# Patient Record
Sex: Male | Born: 1955 | Race: White | Hispanic: No | Marital: Single | Smoking: Former smoker
Health system: Southern US, Community
[De-identification: ages and names within clinical notes are randomized; demographics above are authoritative.]

## PROBLEM LIST (undated history)

## (undated) DIAGNOSIS — D693 Immune thrombocytopenic purpura: Secondary | ICD-10-CM

## (undated) DIAGNOSIS — I10 Essential (primary) hypertension: Secondary | ICD-10-CM

## (undated) DIAGNOSIS — K4090 Unilateral inguinal hernia, without obstruction or gangrene, not specified as recurrent: Secondary | ICD-10-CM

## (undated) DIAGNOSIS — T4145XA Adverse effect of unspecified anesthetic, initial encounter: Secondary | ICD-10-CM

## (undated) DIAGNOSIS — T8859XA Other complications of anesthesia, initial encounter: Secondary | ICD-10-CM

## (undated) DIAGNOSIS — R519 Headache, unspecified: Secondary | ICD-10-CM

## (undated) DIAGNOSIS — E785 Hyperlipidemia, unspecified: Secondary | ICD-10-CM

## (undated) DIAGNOSIS — IMO0002 Reserved for concepts with insufficient information to code with codable children: Secondary | ICD-10-CM

## (undated) DIAGNOSIS — C4491 Basal cell carcinoma of skin, unspecified: Secondary | ICD-10-CM

## (undated) DIAGNOSIS — R51 Headache: Secondary | ICD-10-CM

## (undated) HISTORY — DX: Reserved for concepts with insufficient information to code with codable children: IMO0002

## (undated) HISTORY — PX: EYE SURGERY: SHX253

## (undated) HISTORY — DX: Essential (primary) hypertension: I10

## (undated) HISTORY — DX: Immune thrombocytopenic purpura: D69.3

## (undated) HISTORY — PX: COLONOSCOPY: SHX174

## (undated) HISTORY — DX: Basal cell carcinoma of skin, unspecified: C44.91

## (undated) HISTORY — DX: Hyperlipidemia, unspecified: E78.5

## (undated) HISTORY — PX: NASAL SEPTUM SURGERY: SHX37

---

## 1988-10-22 DIAGNOSIS — C4491 Basal cell carcinoma of skin, unspecified: Secondary | ICD-10-CM

## 1988-10-22 DIAGNOSIS — D693 Immune thrombocytopenic purpura: Secondary | ICD-10-CM

## 1988-10-22 HISTORY — DX: Immune thrombocytopenic purpura: D69.3

## 1988-10-22 HISTORY — DX: Basal cell carcinoma of skin, unspecified: C44.91

## 2008-03-05 ENCOUNTER — Encounter: Admission: RE | Admit: 2008-03-05 | Discharge: 2008-03-05 | Payer: Self-pay | Admitting: Internal Medicine

## 2008-12-04 ENCOUNTER — Emergency Department (HOSPITAL_COMMUNITY): Admission: EM | Admit: 2008-12-04 | Discharge: 2008-12-04 | Payer: Self-pay | Admitting: Emergency Medicine

## 2009-12-28 ENCOUNTER — Ambulatory Visit: Payer: Self-pay | Admitting: Oncology

## 2009-12-30 LAB — CBC & DIFF AND RETIC
BASO%: 1.1 % (ref 0.0–2.0)
HCT: 39.6 % (ref 38.4–49.9)
HGB: 14.1 g/dL (ref 13.0–17.1)
Immature Retic Fract: 5.2 % (ref 0.00–13.40)
MCH: 29.2 pg (ref 27.2–33.4)
MCHC: 35.6 g/dL (ref 32.0–36.0)
NEUT#: 3.6 10*3/uL (ref 1.5–6.5)
NEUT%: 66.8 % (ref 39.0–75.0)
Platelets: 81 10*3/uL — ABNORMAL LOW (ref 140–400)
RDW: 12.6 % (ref 11.0–14.6)
Retic Ct Abs: 50.72 10*3/uL (ref 24.10–77.50)
lymph#: 0.9 10*3/uL (ref 0.9–3.3)

## 2009-12-30 LAB — MORPHOLOGY: PLT EST: DECREASED

## 2009-12-30 LAB — COMPREHENSIVE METABOLIC PANEL
Albumin: 4 g/dL (ref 3.5–5.2)
Calcium: 9.4 mg/dL (ref 8.4–10.5)
Creatinine, Ser: 1.19 mg/dL (ref 0.40–1.50)
Total Protein: 7.2 g/dL (ref 6.0–8.3)

## 2009-12-31 LAB — HEPATITIS B CORE ANTIBODY, IGM: Hep B C IgM: NEGATIVE

## 2009-12-31 LAB — HEPATITIS C ANTIBODY: HCV Ab: NEGATIVE

## 2009-12-31 LAB — HEPATITIS B CORE ANTIBODY, TOTAL: Hep B Core Total Ab: NEGATIVE

## 2009-12-31 LAB — HEPATITIS B SURFACE ANTIBODY,QUALITATIVE: Hep B S Ab: NEGATIVE

## 2010-01-03 LAB — PROTEIN ELECTROPHORESIS, SERUM
Albumin ELP: 61.2 % (ref 55.8–66.1)
Alpha-1-Globulin: 5 % — ABNORMAL HIGH (ref 2.9–4.9)
Alpha-2-Globulin: 10.3 % (ref 7.1–11.8)
Gamma Globulin: 12 % (ref 11.1–18.8)

## 2010-01-03 LAB — TSH: TSH: 0.539 u[IU]/mL (ref 0.350–4.500)

## 2010-01-03 LAB — VITAMIN B12: Vitamin B-12: 561 pg/mL (ref 211–911)

## 2010-01-03 LAB — KAPPA/LAMBDA LIGHT CHAINS
Kappa free light chain: 1.69 mg/dL (ref 0.33–1.94)
Lambda Free Lght Chn: 2.11 mg/dL (ref 0.57–2.63)

## 2010-01-23 LAB — CBC WITH DIFFERENTIAL/PLATELET
BASO%: 0.5 % (ref 0.0–2.0)
Basophils Absolute: 0 10*3/uL (ref 0.0–0.1)
EOS%: 3.7 % (ref 0.0–7.0)
Eosinophils Absolute: 0.2 10*3/uL (ref 0.0–0.5)
LYMPH%: 14 % (ref 14.0–49.0)
MCH: 30.1 pg (ref 27.2–33.4)
MCHC: 35.2 g/dL (ref 32.0–36.0)
MONO#: 0.3 10*3/uL (ref 0.1–0.9)
MONO%: 5.6 % (ref 0.0–14.0)
NEUT%: 76.2 % — ABNORMAL HIGH (ref 39.0–75.0)
RBC: 4.52 10*6/uL (ref 4.20–5.82)
RDW: 13.1 % (ref 11.0–14.6)

## 2010-02-13 ENCOUNTER — Ambulatory Visit: Payer: Self-pay | Admitting: Oncology

## 2010-02-13 LAB — CBC WITH DIFFERENTIAL/PLATELET
BASO%: 0.6 % (ref 0.0–2.0)
Basophils Absolute: 0 10*3/uL (ref 0.0–0.1)
EOS%: 3.5 % (ref 0.0–7.0)
HCT: 37.5 % — ABNORMAL LOW (ref 38.4–49.9)
MCHC: 35.2 g/dL (ref 32.0–36.0)
MCV: 81.7 fL (ref 79.3–98.0)
Platelets: 83 10*3/uL — ABNORMAL LOW (ref 140–400)
RBC: 4.59 10*6/uL (ref 4.20–5.82)
WBC: 4.9 10*3/uL (ref 4.0–10.3)
lymph#: 0.8 10*3/uL — ABNORMAL LOW (ref 0.9–3.3)
nRBC: 0 % (ref 0–0)

## 2010-03-15 ENCOUNTER — Ambulatory Visit: Payer: Self-pay | Admitting: Oncology

## 2010-03-15 LAB — CBC WITH DIFFERENTIAL/PLATELET
Basophils Absolute: 0 10*3/uL (ref 0.0–0.1)
Eosinophils Absolute: 0.2 10*3/uL (ref 0.0–0.5)
MCH: 29.3 pg (ref 27.2–33.4)
MCV: 84.8 fL (ref 79.3–98.0)
NEUT#: 3.1 10*3/uL (ref 1.5–6.5)
NEUT%: 71.2 % (ref 39.0–75.0)
WBC: 4.4 10*3/uL (ref 4.0–10.3)

## 2010-04-30 ENCOUNTER — Emergency Department (HOSPITAL_COMMUNITY): Admission: EM | Admit: 2010-04-30 | Discharge: 2010-04-30 | Payer: Self-pay | Admitting: Emergency Medicine

## 2010-05-01 ENCOUNTER — Encounter: Admission: RE | Admit: 2010-05-01 | Discharge: 2010-05-01 | Payer: Self-pay | Admitting: Internal Medicine

## 2010-05-01 ENCOUNTER — Ambulatory Visit: Payer: Self-pay | Admitting: Oncology

## 2010-05-03 LAB — CBC WITH DIFFERENTIAL/PLATELET
BASO%: 0.7 % (ref 0.0–2.0)
Basophils Absolute: 0 10*3/uL (ref 0.0–0.1)
Eosinophils Absolute: 0.2 10*3/uL (ref 0.0–0.5)
HCT: 40.9 % (ref 38.4–49.9)
LYMPH%: 12.4 % — ABNORMAL LOW (ref 14.0–49.0)
MCV: 82.8 fL (ref 79.3–98.0)
MONO#: 0.5 10*3/uL (ref 0.1–0.9)
RBC: 4.95 10*6/uL (ref 4.20–5.82)
RDW: 12.6 % (ref 11.0–14.6)
WBC: 5.8 10*3/uL (ref 4.0–10.3)
lymph#: 0.7 10*3/uL — ABNORMAL LOW (ref 0.9–3.3)

## 2010-07-03 ENCOUNTER — Ambulatory Visit: Payer: Self-pay | Admitting: Oncology

## 2010-07-03 LAB — CBC WITH DIFFERENTIAL/PLATELET
BASO%: 0.5 % (ref 0.0–2.0)
EOS%: 3.2 % (ref 0.0–7.0)
Eosinophils Absolute: 0.2 10*3/uL (ref 0.0–0.5)
HCT: 43.6 % (ref 38.4–49.9)
HGB: 14.8 g/dL (ref 13.0–17.1)
LYMPH%: 14.6 % (ref 14.0–49.0)
MONO#: 0.5 10*3/uL (ref 0.1–0.9)
MONO%: 8.2 % (ref 0.0–14.0)
NEUT#: 4.5 10*3/uL (ref 1.5–6.5)
Platelets: 51 10*3/uL — ABNORMAL LOW (ref 140–400)
RBC: 5.27 10*6/uL (ref 4.20–5.82)

## 2010-07-07 ENCOUNTER — Other Ambulatory Visit: Admission: RE | Admit: 2010-07-07 | Discharge: 2010-07-07 | Payer: Self-pay | Admitting: Oncology

## 2010-07-14 LAB — CBC WITH DIFFERENTIAL/PLATELET
Basophils Absolute: 0 10*3/uL (ref 0.0–0.1)
EOS%: 4.1 % (ref 0.0–7.0)
HCT: 43.2 % (ref 38.4–49.9)
LYMPH%: 15.7 % (ref 14.0–49.0)
MCHC: 34.2 g/dL (ref 32.0–36.0)
MONO%: 7.1 % (ref 0.0–14.0)
NEUT%: 72.6 % (ref 39.0–75.0)
RDW: 13.9 % (ref 11.0–14.6)
lymph#: 0.8 10*3/uL — ABNORMAL LOW (ref 0.9–3.3)

## 2010-07-28 LAB — CBC WITH DIFFERENTIAL/PLATELET
BASO%: 1 % (ref 0.0–2.0)
MCH: 28.5 pg (ref 27.2–33.4)
MCV: 81 fL (ref 79.3–98.0)
MONO%: 10.7 % (ref 0.0–14.0)
NEUT#: 3.5 10*3/uL (ref 1.5–6.5)
NEUT%: 66.8 % (ref 39.0–75.0)
RBC: 5.36 10*6/uL (ref 4.20–5.82)

## 2010-08-02 ENCOUNTER — Ambulatory Visit: Payer: Self-pay | Admitting: Oncology

## 2010-08-04 LAB — CBC WITH DIFFERENTIAL/PLATELET
HCT: 42.8 % (ref 38.4–49.9)
HGB: 15.1 g/dL (ref 13.0–17.1)
MCH: 28.5 pg (ref 27.2–33.4)
MCHC: 35.3 g/dL (ref 32.0–36.0)
MCV: 80.8 fL (ref 79.3–98.0)
MONO#: 0.5 10*3/uL (ref 0.1–0.9)
NEUT%: 79 % — ABNORMAL HIGH (ref 39.0–75.0)
Platelets: 97 10*3/uL — ABNORMAL LOW (ref 140–400)
RDW: 13.2 % (ref 11.0–14.6)
WBC: 5.3 10*3/uL (ref 4.0–10.3)

## 2010-08-11 LAB — CBC WITH DIFFERENTIAL/PLATELET
HGB: 15.1 g/dL (ref 13.0–17.1)
MCH: 28.7 pg (ref 27.2–33.4)
MCHC: 35.2 g/dL (ref 32.0–36.0)
MCV: 81.6 fL (ref 79.3–98.0)
MONO#: 0.6 10*3/uL (ref 0.1–0.9)
NEUT#: 4.6 10*3/uL (ref 1.5–6.5)
NEUT%: 77.9 % — ABNORMAL HIGH (ref 39.0–75.0)
RBC: 5.26 10*6/uL (ref 4.20–5.82)
lymph#: 0.5 10*3/uL — ABNORMAL LOW (ref 0.9–3.3)

## 2010-08-18 LAB — CBC WITH DIFFERENTIAL/PLATELET
EOS%: 3 % (ref 0.0–7.0)
Eosinophils Absolute: 0.2 10*3/uL (ref 0.0–0.5)
LYMPH%: 7.2 % — ABNORMAL LOW (ref 14.0–49.0)
MCH: 28.8 pg (ref 27.2–33.4)
MCHC: 35.4 g/dL (ref 32.0–36.0)
MCV: 81.3 fL (ref 79.3–98.0)
NEUT#: 4.8 10*3/uL (ref 1.5–6.5)
RDW: 12.9 % (ref 11.0–14.6)

## 2010-09-01 LAB — CBC WITH DIFFERENTIAL/PLATELET
Basophils Absolute: 0 10*3/uL (ref 0.0–0.1)
LYMPH%: 8.7 % — ABNORMAL LOW (ref 14.0–49.0)
MCH: 29.2 pg (ref 27.2–33.4)
MCHC: 34.5 g/dL (ref 32.0–36.0)
MCV: 84.5 fL (ref 79.3–98.0)
MONO#: 0.3 10*3/uL (ref 0.1–0.9)
NEUT%: 78.4 % — ABNORMAL HIGH (ref 39.0–75.0)
Platelets: 86 10*3/uL — ABNORMAL LOW (ref 140–400)
RDW: 13.6 % (ref 11.0–14.6)

## 2010-09-01 LAB — COMPREHENSIVE METABOLIC PANEL
AST: 111 U/L — ABNORMAL HIGH (ref 0–37)
Alkaline Phosphatase: 60 U/L (ref 39–117)
Total Protein: 6.9 g/dL (ref 6.0–8.3)

## 2010-09-01 LAB — LACTATE DEHYDROGENASE: LDH: 176 U/L (ref 94–250)

## 2010-10-02 ENCOUNTER — Ambulatory Visit: Payer: Self-pay | Admitting: Oncology

## 2010-10-04 LAB — COMPREHENSIVE METABOLIC PANEL
ALT: 61 U/L — ABNORMAL HIGH (ref 0–53)
AST: 135 U/L — ABNORMAL HIGH (ref 0–37)
CO2: 31 mEq/L (ref 19–32)
Calcium: 9.4 mg/dL (ref 8.4–10.5)
Creatinine, Ser: 1.12 mg/dL (ref 0.40–1.50)
Glucose, Bld: 89 mg/dL (ref 70–99)
Potassium: 4.2 mEq/L (ref 3.5–5.3)
Total Bilirubin: 0.5 mg/dL (ref 0.3–1.2)
Total Protein: 6.8 g/dL (ref 6.0–8.3)

## 2010-10-04 LAB — CBC WITH DIFFERENTIAL/PLATELET
MCHC: 34.8 g/dL (ref 32.0–36.0)
MCV: 85.3 fL (ref 79.3–98.0)
RBC: 4.9 10*6/uL (ref 4.20–5.82)
RDW: 13.5 % (ref 11.0–14.6)
WBC: 5.2 10*3/uL (ref 4.0–10.3)
lymph#: 0.6 10*3/uL — ABNORMAL LOW (ref 0.9–3.3)

## 2010-11-01 ENCOUNTER — Ambulatory Visit: Payer: Self-pay | Admitting: Oncology

## 2010-11-01 LAB — CBC WITH DIFFERENTIAL/PLATELET
BASO%: 0.9 % (ref 0.0–2.0)
Basophils Absolute: 0.1 10*3/uL (ref 0.0–0.1)
EOS%: 3.8 % (ref 0.0–7.0)
Eosinophils Absolute: 0.2 10*3/uL (ref 0.0–0.5)
HCT: 42.8 % (ref 38.4–49.9)
HGB: 14.7 g/dL (ref 13.0–17.1)
LYMPH%: 9.1 % — ABNORMAL LOW (ref 14.0–49.0)
MCH: 29.3 pg (ref 27.2–33.4)
MCHC: 34.3 g/dL (ref 32.0–36.0)
MCV: 85.3 fL (ref 79.3–98.0)
MONO#: 0.5 10*3/uL (ref 0.1–0.9)
MONO%: 9.3 % (ref 0.0–14.0)
NEUT#: 4.2 10*3/uL (ref 1.5–6.5)
NEUT%: 76.9 % — ABNORMAL HIGH (ref 39.0–75.0)
Platelets: 89 10*3/uL — ABNORMAL LOW (ref 140–400)
RBC: 5.02 10*6/uL (ref 4.20–5.82)
RDW: 13 % (ref 11.0–14.6)
WBC: 5.5 10*3/uL (ref 4.0–10.3)
lymph#: 0.5 10*3/uL — ABNORMAL LOW (ref 0.9–3.3)

## 2010-11-01 LAB — COMPREHENSIVE METABOLIC PANEL
ALT: 30 U/L (ref 0–53)
AST: 27 U/L (ref 0–37)
Albumin: 4.6 g/dL (ref 3.5–5.2)
Alkaline Phosphatase: 66 U/L (ref 39–117)
BUN: 16 mg/dL (ref 6–23)
CO2: 26 mEq/L (ref 19–32)
Calcium: 9.6 mg/dL (ref 8.4–10.5)
Chloride: 101 mEq/L (ref 96–112)
Creatinine, Ser: 1.25 mg/dL (ref 0.40–1.50)
Glucose, Bld: 99 mg/dL (ref 70–99)
Potassium: 4.1 mEq/L (ref 3.5–5.3)
Sodium: 141 mEq/L (ref 135–145)
Total Bilirubin: 0.6 mg/dL (ref 0.3–1.2)
Total Protein: 6.9 g/dL (ref 6.0–8.3)

## 2010-11-01 LAB — MORPHOLOGY: PLT EST: DECREASED

## 2010-11-01 LAB — LACTATE DEHYDROGENASE: LDH: 199 U/L (ref 94–250)

## 2010-11-01 LAB — CHCC SMEAR

## 2010-11-12 ENCOUNTER — Encounter: Payer: Self-pay | Admitting: Internal Medicine

## 2010-12-01 ENCOUNTER — Other Ambulatory Visit: Payer: Self-pay | Admitting: Oncology

## 2010-12-01 ENCOUNTER — Encounter (HOSPITAL_BASED_OUTPATIENT_CLINIC_OR_DEPARTMENT_OTHER): Payer: Managed Care, Other (non HMO) | Admitting: Oncology

## 2010-12-01 DIAGNOSIS — Z5112 Encounter for antineoplastic immunotherapy: Secondary | ICD-10-CM

## 2010-12-01 DIAGNOSIS — D693 Immune thrombocytopenic purpura: Secondary | ICD-10-CM

## 2010-12-01 DIAGNOSIS — D696 Thrombocytopenia, unspecified: Secondary | ICD-10-CM

## 2010-12-01 DIAGNOSIS — R7989 Other specified abnormal findings of blood chemistry: Secondary | ICD-10-CM

## 2010-12-01 LAB — CBC WITH DIFFERENTIAL/PLATELET
Basophils Absolute: 0 10*3/uL (ref 0.0–0.1)
MONO#: 0.4 10*3/uL (ref 0.1–0.9)
MONO%: 8.7 % (ref 0.0–14.0)
NEUT#: 3.3 10*3/uL (ref 1.5–6.5)
NEUT%: 75.8 % — ABNORMAL HIGH (ref 39.0–75.0)
Platelets: 75 10*3/uL — ABNORMAL LOW (ref 140–400)
RBC: 4.85 10*6/uL (ref 4.20–5.82)
WBC: 4.4 10*3/uL (ref 4.0–10.3)
lymph#: 0.5 10*3/uL — ABNORMAL LOW (ref 0.9–3.3)

## 2010-12-01 LAB — COMPREHENSIVE METABOLIC PANEL
ALT: 28 U/L (ref 0–53)
AST: 28 U/L (ref 0–37)
Albumin: 4.5 g/dL (ref 3.5–5.2)
Alkaline Phosphatase: 61 U/L (ref 39–117)
Chloride: 103 mEq/L (ref 96–112)
Creatinine, Ser: 1.21 mg/dL (ref 0.40–1.50)
Potassium: 3.9 mEq/L (ref 3.5–5.3)
Sodium: 143 mEq/L (ref 135–145)
Total Bilirubin: 0.7 mg/dL (ref 0.3–1.2)
Total Protein: 6.8 g/dL (ref 6.0–8.3)

## 2010-12-01 LAB — LACTATE DEHYDROGENASE: LDH: 175 U/L (ref 94–250)

## 2011-01-07 LAB — CBC
HCT: 42.3 % (ref 39.0–52.0)
Hemoglobin: 14.6 g/dL (ref 13.0–17.0)
MCH: 29.3 pg (ref 26.0–34.0)
MCHC: 34.6 g/dL (ref 30.0–36.0)
MCV: 84.7 fL (ref 78.0–100.0)
Platelets: 65 10*3/uL — ABNORMAL LOW (ref 150–400)
RBC: 5 MIL/uL (ref 4.22–5.81)
RDW: 12.6 % (ref 11.5–15.5)
WBC: 9.7 10*3/uL (ref 4.0–10.5)

## 2011-01-07 LAB — DIFFERENTIAL
Basophils Absolute: 0 10*3/uL (ref 0.0–0.1)
Basophils Relative: 0 % (ref 0–1)
Eosinophils Absolute: 0.1 10*3/uL (ref 0.0–0.7)
Eosinophils Relative: 2 % (ref 0–5)
Lymphocytes Relative: 8 % — ABNORMAL LOW (ref 12–46)
Lymphs Abs: 0.8 10*3/uL (ref 0.7–4.0)
Monocytes Absolute: 0.5 K/uL (ref 0.1–1.0)
Monocytes Relative: 5 % (ref 3–12)
Neutro Abs: 8.2 K/uL — ABNORMAL HIGH (ref 1.7–7.7)
Neutrophils Relative %: 85 % — ABNORMAL HIGH (ref 43–77)

## 2011-01-07 LAB — BASIC METABOLIC PANEL WITH GFR
CO2: 24 meq/L (ref 19–32)
Chloride: 107 meq/L (ref 96–112)
GFR calc non Af Amer: 47 mL/min — ABNORMAL LOW (ref >60)
Glucose, Bld: 147 mg/dL — ABNORMAL HIGH (ref 70–99)
Potassium: 4 meq/L (ref 3.5–5.1)
Sodium: 140 meq/L (ref 135–145)

## 2011-01-07 LAB — APTT: aPTT: 50 seconds — ABNORMAL HIGH (ref 24–37)

## 2011-01-07 LAB — BASIC METABOLIC PANEL
BUN: 17 mg/dL (ref 6–23)
Calcium: 9 mg/dL (ref 8.4–10.5)
Creatinine, Ser: 1.55 mg/dL — ABNORMAL HIGH (ref 0.4–1.5)
GFR calc Af Amer: 57 mL/min — ABNORMAL LOW (ref >60)

## 2011-01-07 LAB — SAMPLE TO BLOOD BANK

## 2011-01-07 LAB — PROTIME-INR
INR: 1.11 (ref 0.00–1.49)
Prothrombin Time: 14.2 s (ref 11.6–15.2)

## 2011-01-31 ENCOUNTER — Encounter (HOSPITAL_BASED_OUTPATIENT_CLINIC_OR_DEPARTMENT_OTHER): Payer: Managed Care, Other (non HMO) | Admitting: Oncology

## 2011-01-31 ENCOUNTER — Other Ambulatory Visit: Payer: Self-pay | Admitting: Oncology

## 2011-01-31 DIAGNOSIS — Z5112 Encounter for antineoplastic immunotherapy: Secondary | ICD-10-CM

## 2011-01-31 DIAGNOSIS — D693 Immune thrombocytopenic purpura: Secondary | ICD-10-CM

## 2011-01-31 DIAGNOSIS — D696 Thrombocytopenia, unspecified: Secondary | ICD-10-CM

## 2011-01-31 LAB — CBC WITH DIFFERENTIAL/PLATELET
BASO%: 0.5 % (ref 0.0–2.0)
Basophils Absolute: 0 10*3/uL (ref 0.0–0.1)
EOS%: 3.9 % (ref 0.0–7.0)
HCT: 41.3 % (ref 38.4–49.9)
HGB: 14.1 g/dL (ref 13.0–17.1)
LYMPH%: 11.4 % — ABNORMAL LOW (ref 14.0–49.0)
MCH: 29 pg (ref 27.2–33.4)
MCHC: 34.2 g/dL (ref 32.0–36.0)
MCV: 85 fL (ref 79.3–98.0)
MONO%: 9.9 % (ref 0.0–14.0)
NEUT%: 74.3 % (ref 39.0–75.0)
Platelets: 74 10*3/uL — ABNORMAL LOW (ref 140–400)
lymph#: 0.6 10*3/uL — ABNORMAL LOW (ref 0.9–3.3)

## 2011-03-09 ENCOUNTER — Encounter (HOSPITAL_BASED_OUTPATIENT_CLINIC_OR_DEPARTMENT_OTHER): Payer: Managed Care, Other (non HMO) | Admitting: Oncology

## 2011-03-09 ENCOUNTER — Other Ambulatory Visit: Payer: Self-pay | Admitting: Oncology

## 2011-03-09 DIAGNOSIS — D693 Immune thrombocytopenic purpura: Secondary | ICD-10-CM

## 2011-03-09 DIAGNOSIS — R7989 Other specified abnormal findings of blood chemistry: Secondary | ICD-10-CM

## 2011-03-09 LAB — CBC WITH DIFFERENTIAL/PLATELET
BASO%: 0.5 % (ref 0.0–2.0)
Basophils Absolute: 0 10*3/uL (ref 0.0–0.1)
EOS%: 3.8 % (ref 0.0–7.0)
MCH: 29.3 pg (ref 27.2–33.4)
MCHC: 34.6 g/dL (ref 32.0–36.0)
MCV: 84.6 fL (ref 79.3–98.0)
MONO%: 9 % (ref 0.0–14.0)
RBC: 4.45 10*6/uL (ref 4.20–5.82)
RDW: 13 % (ref 11.0–14.6)
lymph#: 0.6 10*3/uL — ABNORMAL LOW (ref 0.9–3.3)

## 2011-03-09 LAB — COMPREHENSIVE METABOLIC PANEL
AST: 30 U/L (ref 0–37)
Albumin: 4.1 g/dL (ref 3.5–5.2)
Alkaline Phosphatase: 52 U/L (ref 39–117)
CO2: 24 mEq/L (ref 19–32)
Chloride: 102 mEq/L (ref 96–112)
Glucose, Bld: 113 mg/dL — ABNORMAL HIGH (ref 70–99)

## 2011-03-09 LAB — MORPHOLOGY: PLT EST: DECREASED

## 2011-05-04 ENCOUNTER — Encounter (HOSPITAL_BASED_OUTPATIENT_CLINIC_OR_DEPARTMENT_OTHER): Payer: Managed Care, Other (non HMO) | Admitting: Oncology

## 2011-05-04 ENCOUNTER — Other Ambulatory Visit: Payer: Self-pay | Admitting: Oncology

## 2011-05-04 DIAGNOSIS — R7989 Other specified abnormal findings of blood chemistry: Secondary | ICD-10-CM

## 2011-05-04 DIAGNOSIS — D693 Immune thrombocytopenic purpura: Secondary | ICD-10-CM

## 2011-05-04 LAB — CBC WITH DIFFERENTIAL/PLATELET
Basophils Absolute: 0 10*3/uL (ref 0.0–0.1)
EOS%: 2.9 % (ref 0.0–7.0)
HGB: 13.1 g/dL (ref 13.0–17.1)
LYMPH%: 9.6 % — ABNORMAL LOW (ref 14.0–49.0)
MCH: 29.3 pg (ref 27.2–33.4)
MCV: 83.7 fL (ref 79.3–98.0)
MONO%: 9.9 % (ref 0.0–14.0)
NEUT%: 77.3 % — ABNORMAL HIGH (ref 39.0–75.0)
Platelets: 99 10*3/uL — ABNORMAL LOW (ref 140–400)
RDW: 12.9 % (ref 11.0–14.6)

## 2011-07-04 ENCOUNTER — Other Ambulatory Visit: Payer: Self-pay | Admitting: Oncology

## 2011-07-04 ENCOUNTER — Encounter (HOSPITAL_BASED_OUTPATIENT_CLINIC_OR_DEPARTMENT_OTHER): Payer: Managed Care, Other (non HMO) | Admitting: Oncology

## 2011-07-04 DIAGNOSIS — R7989 Other specified abnormal findings of blood chemistry: Secondary | ICD-10-CM

## 2011-07-04 DIAGNOSIS — D693 Immune thrombocytopenic purpura: Secondary | ICD-10-CM

## 2011-07-04 LAB — CBC WITH DIFFERENTIAL/PLATELET
EOS%: 3 % (ref 0.0–7.0)
LYMPH%: 11.5 % — ABNORMAL LOW (ref 14.0–49.0)
MCH: 29.6 pg (ref 27.2–33.4)
MCHC: 34.6 g/dL (ref 32.0–36.0)
MCV: 85.6 fL (ref 79.3–98.0)
MONO%: 7.3 % (ref 0.0–14.0)
NEUT#: 4 10*3/uL (ref 1.5–6.5)
RBC: 4.64 10*6/uL (ref 4.20–5.82)
RDW: 13.4 % (ref 11.0–14.6)

## 2011-07-04 LAB — COMPREHENSIVE METABOLIC PANEL
AST: 24 U/L (ref 0–37)
Albumin: 4.5 g/dL (ref 3.5–5.2)
Alkaline Phosphatase: 56 U/L (ref 39–117)
Potassium: 3.9 mEq/L (ref 3.5–5.3)
Sodium: 141 mEq/L (ref 135–145)
Total Bilirubin: 0.7 mg/dL (ref 0.3–1.2)
Total Protein: 6.6 g/dL (ref 6.0–8.3)

## 2011-08-05 ENCOUNTER — Encounter: Payer: Self-pay | Admitting: Oncology

## 2011-08-05 DIAGNOSIS — D693 Immune thrombocytopenic purpura: Secondary | ICD-10-CM | POA: Insufficient documentation

## 2011-08-24 ENCOUNTER — Encounter: Payer: Self-pay | Admitting: Oncology

## 2011-08-24 DIAGNOSIS — I10 Essential (primary) hypertension: Secondary | ICD-10-CM | POA: Insufficient documentation

## 2011-08-24 DIAGNOSIS — E785 Hyperlipidemia, unspecified: Secondary | ICD-10-CM | POA: Insufficient documentation

## 2011-09-05 ENCOUNTER — Other Ambulatory Visit: Payer: Self-pay | Admitting: Oncology

## 2011-09-05 ENCOUNTER — Telehealth: Payer: Self-pay | Admitting: Oncology

## 2011-09-05 ENCOUNTER — Other Ambulatory Visit (HOSPITAL_BASED_OUTPATIENT_CLINIC_OR_DEPARTMENT_OTHER): Payer: Managed Care, Other (non HMO) | Admitting: Lab

## 2011-09-05 DIAGNOSIS — D693 Immune thrombocytopenic purpura: Secondary | ICD-10-CM

## 2011-09-05 DIAGNOSIS — R7989 Other specified abnormal findings of blood chemistry: Secondary | ICD-10-CM

## 2011-09-05 LAB — CBC WITH DIFFERENTIAL/PLATELET
Basophils Absolute: 0 10*3/uL (ref 0.0–0.1)
EOS%: 2.3 % (ref 0.0–7.0)
Eosinophils Absolute: 0.2 10*3/uL (ref 0.0–0.5)
HGB: 14.6 g/dL (ref 13.0–17.1)
LYMPH%: 8.3 % — ABNORMAL LOW (ref 14.0–49.0)
MCH: 29.5 pg (ref 27.2–33.4)
MCV: 85 fL (ref 79.3–98.0)
MONO%: 8.9 % (ref 0.0–14.0)
Platelets: 85 10*3/uL — ABNORMAL LOW (ref 140–400)
RBC: 4.97 10*6/uL (ref 4.20–5.82)
RDW: 12.7 % (ref 11.0–14.6)

## 2011-09-05 NOTE — Telephone Encounter (Signed)
lmonvm advising the pt of his jan 2013 appts °

## 2011-09-06 ENCOUNTER — Telehealth: Payer: Self-pay | Admitting: *Deleted

## 2011-09-06 NOTE — Telephone Encounter (Signed)
Message copied by Wende Mott on Thu Sep 06, 2011 10:07 AM ------      Message from: HA, Raliegh Ip T      Created: Wed Sep 05, 2011  2:43 PM       Please call pt.  His plt is stable.  Continue observation.  Scheduler will call him to schedule an appointment with me in Jan 2013.

## 2011-09-06 NOTE — Telephone Encounter (Signed)
Called pt and left VM requesting he call back regarding his lab results.

## 2011-09-06 NOTE — Telephone Encounter (Signed)
Pt called back and I relayed Dr. Lodema Pilot note and informed of Platelet count 85.  Pt states already has appt schedled in January and verbalized understanding to call us if any problems or bleeding prior to his next appt.

## 2011-11-06 ENCOUNTER — Telehealth: Payer: Self-pay | Admitting: Oncology

## 2011-11-06 NOTE — Telephone Encounter (Signed)
pt called to r/s appt for 1-17 to 1-30

## 2011-11-08 ENCOUNTER — Ambulatory Visit: Payer: Managed Care, Other (non HMO) | Admitting: Oncology

## 2011-11-08 ENCOUNTER — Other Ambulatory Visit: Payer: Managed Care, Other (non HMO) | Admitting: Lab

## 2011-11-09 ENCOUNTER — Encounter: Payer: Self-pay | Admitting: *Deleted

## 2011-11-21 ENCOUNTER — Telehealth: Payer: Self-pay | Admitting: Oncology

## 2011-11-21 ENCOUNTER — Other Ambulatory Visit: Payer: Managed Care, Other (non HMO) | Admitting: Lab

## 2011-11-21 ENCOUNTER — Ambulatory Visit (HOSPITAL_BASED_OUTPATIENT_CLINIC_OR_DEPARTMENT_OTHER): Payer: Managed Care, Other (non HMO) | Admitting: Oncology

## 2011-11-21 VITALS — BP 117/68 | HR 71 | Temp 97.1°F | Ht 71.0 in | Wt 192.5 lb

## 2011-11-21 DIAGNOSIS — D693 Immune thrombocytopenic purpura: Secondary | ICD-10-CM

## 2011-11-21 DIAGNOSIS — Z23 Encounter for immunization: Secondary | ICD-10-CM

## 2011-11-21 DIAGNOSIS — I10 Essential (primary) hypertension: Secondary | ICD-10-CM

## 2011-11-21 DIAGNOSIS — E785 Hyperlipidemia, unspecified: Secondary | ICD-10-CM

## 2011-11-21 LAB — CBC WITH DIFFERENTIAL/PLATELET
Basophils Absolute: 0 10*3/uL (ref 0.0–0.1)
EOS%: 4.3 % (ref 0.0–7.0)
Eosinophils Absolute: 0.2 10*3/uL (ref 0.0–0.5)
HCT: 38.4 % (ref 38.4–49.9)
HGB: 13.6 g/dL (ref 13.0–17.1)
LYMPH%: 14.8 % (ref 14.0–49.0)
MCH: 29.9 pg (ref 27.2–33.4)
MCV: 84.2 fL (ref 79.3–98.0)
MONO%: 7.9 % (ref 0.0–14.0)
NEUT#: 3.2 10*3/uL (ref 1.5–6.5)
NEUT%: 72.3 % (ref 39.0–75.0)
Platelets: 87 10*3/uL — ABNORMAL LOW (ref 140–400)
RDW: 12.6 % (ref 11.0–14.6)

## 2011-11-21 LAB — COMPREHENSIVE METABOLIC PANEL
AST: 23 U/L (ref 0–37)
Albumin: 4.3 g/dL (ref 3.5–5.2)
Alkaline Phosphatase: 54 U/L (ref 39–117)
BUN: 17 mg/dL (ref 6–23)
Glucose, Bld: 94 mg/dL (ref 70–99)
Potassium: 4.1 mEq/L (ref 3.5–5.3)
Total Bilirubin: 0.6 mg/dL (ref 0.3–1.2)

## 2011-11-21 MED ORDER — INFLUENZA VIRUS VACC SPLIT PF IM SUSP
0.5000 mL | Freq: Once | INTRAMUSCULAR | Status: AC
  Administered 2011-11-21: 0.5 mL via INTRAMUSCULAR
  Filled 2011-11-21: qty 0.5

## 2011-11-21 NOTE — Progress Notes (Signed)
[facility] OFFICE PROGRESS NOTE  Cc:  No primary provider on file.  DIAGNOSIS:  Recurrent, refractory, chronic  idiopathic thrombocytopenic purpura.  PAST TREATMENT:  4 weekly doses of Rituxan between 07/28/2010 and 08/18/2010.  CURRENT THERAPY:  watchful observation.  INTERVAL HISTORY: Phillip Morris 56 y.o. male returns for regular follow up.  He reports feeling well.  He has rare epistaxis episodes with the dry winter weather.  Patient denies fatigue, headache, visual changes, confusion, drenching night sweats, palpable lymph node swelling, mucositis, odynophagia, dysphagia, nausea vomiting, jaundice, chest pain, palpitation, shortness of breath, dyspnea on exertion, productive cough, gum bleeding, hematemesis, hemoptysis, abdominal pain, abdominal swelling, early satiety, melena, hematochezia, hematuria, skin rash, spontaneous bleeding, joint swelling, joint pain, heat or cold intolerance, bowel bladder incontinence, back pain, focal motor weakness, paresthesia, depression, suicidal or homocidal ideation, feeling hopelessness.   MEDICAL HISTORY: Past Medical History  Diagnosis Date  . ITP (idiopathic thrombocytopenic purpura)   . Basal cell carcinoma 1990  . Squamous cell carcinoma     of skin  . Hypertension   . Hyperlipemia     SURGICAL HISTORY: No past surgical history on file.  MEDICATIONS: Current Outpatient Prescriptions  Medication Sig Dispense Refill  . CRESTOR 5 MG tablet Take 5 mg by mouth daily.       Marland Kitchen lisinopril-hydrochlorothiazide (PRINZIDE,ZESTORETIC) 10-12.5 MG per tablet Take 1 tablet by mouth daily.         ALLERGIES:  is allergic to atorvastatin.  REVIEW OF SYSTEMS:  The rest of the 14-point review of system was negative.   Filed Vitals:   11/21/11 1401  BP: 117/68  Pulse: 71  Temp: 97.1 F (36.2 C)   Wt Readings from Last 3 Encounters:  11/21/11 192 lb 8 oz (87.317 kg)  07/04/11 188 lb 1.6 oz (85.322 kg)   ECOG Performance status:  0  PHYSICAL EXAMINATION:   General:  well-nourished in no acute distress.  Eyes:  no scleral icterus.  ENT:  There were no oropharyngeal lesions.  Neck was without thyromegaly.  Lymphatics:  Negative cervical, supraclavicular or axillary adenopathy.  Respiratory: lungs were clear bilaterally without wheezing or crackles.  Cardiovascular:  Regular rate and rhythm, S1/S2, without murmur, rub or gallop.  There was no pedal edema.  GI:  abdomen was soft, flat, nontender, nondistended, without organomegaly.  Muscoloskeletal:  no spinal tenderness of palpation of vertebral spine.  Skin exam was without echymosis, petichae.  Neuro exam was nonfocal.  Patient was able to get on and off exam table without assistance.  Gait was normal.  Patient was alerted and oriented.  Attention was good.   Language was appropriate.  Mood was normal without depression.  Speech was not pressured.  Thought content was not tangential.      LABORATORY/RADIOLOGY DATA:  Lab Results  Component Value Date   WBC 4.4 11/21/2011   HGB 13.6 11/21/2011   HCT 38.4 11/21/2011   PLT 87* 11/21/2011   GLUCOSE 110* 07/04/2011   ALT 28 07/04/2011   AST 24 07/04/2011   NA 141 07/04/2011   K 3.9 07/04/2011   CL 103 07/04/2011   CREATININE 1.30 07/04/2011   BUN 17 07/04/2011   CO2 29 07/04/2011   TSH 0.539 12/30/2009   INR 1.11 04/30/2010   ASSESSMENT AND PLAN:      1. Recurrent refractory chronic idiopathic thrombocytopenic purpura:  Again, stable plt.  There is no evidence of clinically significant bleeding. His plt has been >30K.  Will continue to  observe at this time.  In the future, if he has worsening ITP to plg <30K, I may consider Nplate or Promecta.  2. Hyperlipidemia:  He is on rosuvastatin.  3. Hypertention:  Well controlled with ACE-I/HCTZ per PCP. 4. Followup:  Lab only in 4 months.  Lab/RV with me in 8 months.  5. Primary care:  He has not received influenza vaccination this season.  We discussed the pros and cons; he wished to  received an influenza vaccination today.    The length of time of the face-to-face encounter was 10 minutes. More than 50% of time was spent counseling and coordination of care.

## 2011-11-21 NOTE — Telephone Encounter (Signed)
Gv pt appt for [id number] 

## 2012-03-20 ENCOUNTER — Telehealth: Payer: Self-pay

## 2012-03-20 ENCOUNTER — Other Ambulatory Visit: Payer: Managed Care, Other (non HMO)

## 2012-03-20 ENCOUNTER — Other Ambulatory Visit (HOSPITAL_BASED_OUTPATIENT_CLINIC_OR_DEPARTMENT_OTHER): Payer: Managed Care, Other (non HMO) | Admitting: Lab

## 2012-03-20 DIAGNOSIS — D693 Immune thrombocytopenic purpura: Secondary | ICD-10-CM

## 2012-03-20 LAB — CBC WITH DIFFERENTIAL/PLATELET
EOS%: 3.3 % (ref 0.0–7.0)
Eosinophils Absolute: 0.2 10*3/uL (ref 0.0–0.5)
LYMPH%: 13.2 % — ABNORMAL LOW (ref 14.0–49.0)
MCH: 29 pg (ref 27.2–33.4)
MCV: 84.2 fL (ref 79.3–98.0)
MONO%: 9.4 % (ref 0.0–14.0)
NEUT#: 3.8 10*3/uL (ref 1.5–6.5)
Platelets: 78 10*3/uL — ABNORMAL LOW (ref 140–400)
RBC: 4.81 10*6/uL (ref 4.20–5.82)
RDW: 12.8 % (ref 11.0–14.6)
nRBC: 0 % (ref 0–0)

## 2012-03-20 NOTE — Telephone Encounter (Signed)
Message copied by Kallie Locks on Thu Mar 20, 2012  4:50 PM ------      Message from: HA, Raliegh Ip T      Created: Thu Mar 20, 2012  2:04 PM       Please call pt.  His plt count is still low but stable (from ITP).  Again, I recommend watchful observation.  Thanks.

## 2012-07-17 NOTE — Patient Instructions (Addendum)
1. Diagnosis: Chronic ITP. 2. Current status: Stable platelet count. 3. Treatment recommendation: Observation with once every 6 months blood count. Return to visit in about one year.

## 2012-07-18 ENCOUNTER — Telehealth: Payer: Self-pay | Admitting: Oncology

## 2012-07-18 ENCOUNTER — Other Ambulatory Visit (HOSPITAL_BASED_OUTPATIENT_CLINIC_OR_DEPARTMENT_OTHER): Payer: Managed Care, Other (non HMO)

## 2012-07-18 ENCOUNTER — Ambulatory Visit (HOSPITAL_BASED_OUTPATIENT_CLINIC_OR_DEPARTMENT_OTHER): Payer: Managed Care, Other (non HMO) | Admitting: Oncology

## 2012-07-18 VITALS — BP 125/77 | HR 61 | Temp 96.9°F | Resp 20 | Ht 71.0 in | Wt 192.8 lb

## 2012-07-18 DIAGNOSIS — D693 Immune thrombocytopenic purpura: Secondary | ICD-10-CM

## 2012-07-18 LAB — CBC WITH DIFFERENTIAL/PLATELET
BASO%: 1.2 % (ref 0.0–2.0)
EOS%: 3.1 % (ref 0.0–7.0)
HCT: 40.9 % (ref 38.4–49.9)
LYMPH%: 12.9 % — ABNORMAL LOW (ref 14.0–49.0)
MCH: 29 pg (ref 27.2–33.4)
MCHC: 34.3 g/dL (ref 32.0–36.0)
NEUT%: 75 % (ref 39.0–75.0)
Platelets: 77 10*3/uL — ABNORMAL LOW (ref 140–400)
RBC: 4.84 10*6/uL (ref 4.20–5.82)
WBC: 5.7 10*3/uL (ref 4.0–10.3)
lymph#: 0.7 10*3/uL — ABNORMAL LOW (ref 0.9–3.3)

## 2012-07-18 LAB — COMPREHENSIVE METABOLIC PANEL (CC13)
ALT: 22 U/L (ref 0–55)
AST: 20 U/L (ref 5–34)
Alkaline Phosphatase: 65 U/L (ref 40–150)
BUN: 24 mg/dL (ref 7.0–26.0)
Creatinine: 1.4 mg/dL — ABNORMAL HIGH (ref 0.7–1.3)

## 2012-07-18 LAB — CHCC SMEAR

## 2012-07-18 NOTE — Telephone Encounter (Signed)
appts made and printed for pt aom °

## 2012-07-18 NOTE — Progress Notes (Signed)
[facility]  Telephone:([phone]) [phone] Fax:([phone]) [phone]   OFFICE PROGRESS NOTE   Cc:  No primary provider on file.  DIAGNOSIS: Recurrent, refractory, chronic idiopathic thrombocytopenic purpura.   PAST TREATMENT:  1.  Prednisone in distant past. 2.  IVIg in distant past.  3.  4 weekly doses of Rituxan between 07/28/2010 and 08/18/2010.   CURRENT THERAPY: watchful observation.  INTERVAL HISTORY: Phillip Morris 57 y.o. male returns for regular follow up.  He reports feeling well.  He denied any spontaneous bleeding or patichae.  He works full time and bikes without fatigue.    Patient denies fever, anorexia, weight loss, fatigue, headache, visual changes, confusion, drenching night sweats, palpable lymph node swelling, mucositis, odynophagia, dysphagia, nausea vomiting, jaundice, chest pain, palpitation, shortness of breath, dyspnea on exertion, productive cough, gum bleeding, epistaxis, hematemesis, hemoptysis, abdominal pain, abdominal swelling, early satiety, melena, hematochezia, hematuria, skin rash, spontaneous bleeding, joint swelling, joint pain, heat or cold intolerance, bowel bladder incontinence, back pain, focal motor weakness, paresthesia, depression, suicidal or homicidal ideation, feeling hopelessness.   Past Medical History  Diagnosis Date  . ITP (idiopathic thrombocytopenic purpura)   . Basal cell carcinoma 1990  . Squamous cell carcinoma     of skin  . Hypertension   . Hyperlipemia     No past surgical history on file.  Current Outpatient Prescriptions  Medication Sig Dispense Refill  . CRESTOR 5 MG tablet Take 5 mg by mouth daily.       Marland Kitchen lisinopril (PRINIVIL,ZESTRIL) 10 MG tablet Take 10 mg by mouth daily.         ALLERGIES:  is allergic to atorvastatin.  REVIEW OF SYSTEMS:  The rest of the 14-point review of system was negative.   Filed Vitals:   07/18/12 1427  BP: 125/77  Pulse: 61  Temp: 96.9 F (36.1 C)  Resp: 20   Wt Readings  from Last 3 Encounters:  07/18/12 192 lb 12.8 oz (87.454 kg)  11/21/11 192 lb 8 oz (87.317 kg)  07/04/11 188 lb 1.6 oz (85.322 kg)   ECOG Performance status: 0  PHYSICAL EXAMINATION:   General:  well-nourished in no acute distress.  Eyes:  no scleral icterus.  ENT:  There were no oropharyngeal lesions.  Neck was without thyromegaly.  Lymphatics:  Negative cervical, supraclavicular or axillary adenopathy.  Respiratory: lungs were clear bilaterally without wheezing or crackles.  Cardiovascular:  Regular rate and rhythm, S1/S2, without murmur, rub or gallop.  There was no pedal edema.  GI:  abdomen was soft, flat, nontender, nondistended, without organomegaly.  Muscoloskeletal:  no spinal tenderness of palpation of vertebral spine.  Skin exam was without echymosis, petichae.  Neuro exam was nonfocal.  Patient was able to get on and off exam table without assistance.  Gait was normal.  Patient was alerted and oriented.  Attention was good.   Language was appropriate.  Mood was normal without depression.  Speech was not pressured.  Thought content was not tangential.     LABORATORY/RADIOLOGY DATA:  Lab Results  Component Value Date   WBC 5.7 07/18/2012   HGB 14.0 07/18/2012   HCT 40.9 07/18/2012   PLT 77* 07/18/2012   GLUCOSE 101* 07/18/2012   ALKPHOS 65 07/18/2012   ALT 22 07/18/2012   AST 20 07/18/2012   NA 140 07/18/2012   K 3.9 07/18/2012   CL 106 07/18/2012   CREATININE 1.4* 07/18/2012   BUN 24.0 07/18/2012   CO2 25 07/18/2012   INR 1.11  04/30/2010     ASSESSMENT AND PLAN:    1. Recurrent refractory chronic idiopathic thrombocytopenic purpura: Again, stable plt. There is no evidence of clinically significant bleeding. His plt has been >30K. Will continue to observe at this time. In the future, if he has pancytopenia, then a repeat bone marrow biopsy maybe considered.  If he only has worsening ITP to plg <30K, I may consider Nplate or Promecta.  2. Hyperlipidemia: He is on rosuvastatin.   3. Hypertention: Well controlled with Lisinopril per PCP.  4. Followup: Lab only in 4 and 8 months.  RV in about 1 year.    The length of time of the face-to-face encounter was 15  minutes. More than 50% of time was spent counseling and coordination of care.

## 2012-12-06 ENCOUNTER — Other Ambulatory Visit: Payer: Self-pay

## 2013-01-15 ENCOUNTER — Other Ambulatory Visit (HOSPITAL_BASED_OUTPATIENT_CLINIC_OR_DEPARTMENT_OTHER): Payer: BC Managed Care – PPO

## 2013-01-15 DIAGNOSIS — D693 Immune thrombocytopenic purpura: Secondary | ICD-10-CM

## 2013-01-15 LAB — CBC WITH DIFFERENTIAL/PLATELET
Basophils Absolute: 0.1 10*3/uL (ref 0.0–0.1)
EOS%: 3.4 % (ref 0.0–7.0)
Eosinophils Absolute: 0.2 10*3/uL (ref 0.0–0.5)
HCT: 41.3 % (ref 38.4–49.9)
HGB: 14.1 g/dL (ref 13.0–17.1)
MCH: 28.7 pg (ref 27.2–33.4)
MONO#: 0.5 10*3/uL (ref 0.1–0.9)
NEUT#: 4.2 10*3/uL (ref 1.5–6.5)
RDW: 13.3 % (ref 11.0–14.6)
WBC: 5.9 10*3/uL (ref 4.0–10.3)
lymph#: 0.9 10*3/uL (ref 0.9–3.3)

## 2013-07-11 ENCOUNTER — Telehealth: Payer: Self-pay | Admitting: *Deleted

## 2013-07-11 NOTE — Telephone Encounter (Signed)
Lm informed the pt that his appts for 07/17/13 was cancel and rs. gv appt for 08/17/13 w/labs@ 2pm and ov @ 2:30pm. Pt is aware that i mailed a letter/cal...td

## 2013-07-17 ENCOUNTER — Other Ambulatory Visit: Payer: Managed Care, Other (non HMO) | Admitting: Lab

## 2013-07-17 ENCOUNTER — Ambulatory Visit: Payer: Managed Care, Other (non HMO) | Admitting: Oncology

## 2013-07-22 ENCOUNTER — Telehealth: Payer: Self-pay | Admitting: Hematology and Oncology

## 2013-07-22 NOTE — Telephone Encounter (Signed)
Moved 10/27 lb/NG to 12:30pm and 1pm due to call day. S/w pt he is aware.

## 2013-08-17 ENCOUNTER — Other Ambulatory Visit (HOSPITAL_BASED_OUTPATIENT_CLINIC_OR_DEPARTMENT_OTHER): Payer: BC Managed Care – PPO

## 2013-08-17 ENCOUNTER — Telehealth: Payer: Self-pay | Admitting: Hematology and Oncology

## 2013-08-17 ENCOUNTER — Encounter: Payer: Self-pay | Admitting: Hematology and Oncology

## 2013-08-17 ENCOUNTER — Other Ambulatory Visit: Payer: Self-pay | Admitting: Hematology and Oncology

## 2013-08-17 ENCOUNTER — Ambulatory Visit (HOSPITAL_BASED_OUTPATIENT_CLINIC_OR_DEPARTMENT_OTHER): Payer: BC Managed Care – PPO | Admitting: Hematology and Oncology

## 2013-08-17 VITALS — BP 127/79 | HR 57 | Temp 96.8°F | Resp 18 | Ht 71.0 in | Wt 193.8 lb

## 2013-08-17 DIAGNOSIS — D693 Immune thrombocytopenic purpura: Secondary | ICD-10-CM

## 2013-08-17 DIAGNOSIS — R918 Other nonspecific abnormal finding of lung field: Secondary | ICD-10-CM

## 2013-08-17 LAB — COMPREHENSIVE METABOLIC PANEL (CC13)
Albumin: 3.8 g/dL (ref 3.5–5.0)
BUN: 23.7 mg/dL (ref 7.0–26.0)
Calcium: 9.8 mg/dL (ref 8.4–10.4)
Chloride: 105 mEq/L (ref 98–109)
Glucose: 106 mg/dl (ref 70–140)
Potassium: 4.2 mEq/L (ref 3.5–5.1)

## 2013-08-17 LAB — CBC WITH DIFFERENTIAL/PLATELET
Basophils Absolute: 0 10*3/uL (ref 0.0–0.1)
Eosinophils Absolute: 0.2 10*3/uL (ref 0.0–0.5)
HCT: 38.9 % (ref 38.4–49.9)
HGB: 13.6 g/dL (ref 13.0–17.1)
MCV: 82.9 fL (ref 79.3–98.0)
MONO%: 8.7 % (ref 0.0–14.0)
NEUT#: 3.2 10*3/uL (ref 1.5–6.5)
NEUT%: 67.4 % (ref 39.0–75.0)
RDW: 12.6 % (ref 11.0–14.6)

## 2013-08-17 NOTE — Progress Notes (Signed)
[facility] OFFICE PROGRESS NOTE  Phillip D, MD DIAGNOSIS:  Steroid refractory ITP, on observation  SUMMARY OF HEMATOLOGIC HISTORY: This is a pleasant gentleman who was diagnosed with ITP after routine blood work. He had bone marrow aspirate and biopsy which was unremarkable. When his platelet count dropped to about 30,000, he was started on prednisone but soon become refractory. He received 4 doses of rituximab between October to November 2011. INTERVAL HISTORY: Phillip Morris 57 y.o. male returns for further followup. He is doing well. The patient denies any recent signs or symptoms of bleeding such as spontaneous epistaxis, hematuria or hematochezia. He denies any recent fever, chills, night sweats or abnormal weight loss  I have reviewed the past medical history, past surgical history, social history and family history with the patient and they are unchanged from previous note.  ALLERGIES:  is allergic to atorvastatin.  MEDICATIONS:  Current Outpatient Prescriptions  Medication Sig Dispense Refill  . CRESTOR 5 MG tablet Take 5 mg by mouth daily.       Marland Kitchen lisinopril (PRINIVIL,ZESTRIL) 10 MG tablet Take 10 mg by mouth daily.        No current facility-administered medications for this visit.     REVIEW OF SYSTEMS:   Constitutional: Denies fevers, chills or night sweats Eyes: Denies blurriness of vision Ears, nose, mouth, throat, and face: Denies mucositis or sore throat Respiratory: Denies cough, dyspnea or wheezes Cardiovascular: Denies palpitation, chest discomfort or lower extremity swelling Gastrointestinal:  Denies nausea, heartburn or change in bowel habits Skin: Denies abnormal skin rashes Lymphatics: Denies new lymphadenopathy or easy bruising Neurological:Denies numbness, tingling or new weaknesses Behavioral/Psych: Mood is stable, no new changes  All other systems were reviewed with the patient and are negative.  PHYSICAL EXAMINATION: ECOG  PERFORMANCE STATUS: 0 - Asymptomatic  Filed Vitals:   08/17/13 1257  BP: 127/79  Pulse: 57  Temp: 96.8 F (36 C)  Resp: 18   Filed Weights   08/17/13 1257  Weight: 193 lb 12.8 oz (87.907 kg)    GENERAL:alert, no distress and comfortable SKIN: skin color, texture, turgor are normal, no rashes or significant lesions EYES: normal, Conjunctiva are pink and non-injected, sclera clear OROPHARYNX:no exudate, no erythema and lips, buccal mucosa, and tongue normal  NECK: supple, thyroid normal size, non-tender, without nodularity LYMPH:  no palpable lymphadenopathy in the cervical, axillary or inguinal LUNGS: clear to auscultation and percussion with normal breathing effort HEART: regular rate & rhythm and no murmurs and no lower extremity edema ABDOMEN:abdomen soft, non-tender and normal bowel sounds. No palpable splenomegaly Musculoskeletal:no cyanosis of digits and no clubbing  NEURO: alert & oriented x 3 with fluent speech, no focal motor/sensory deficits  LABORATORY DATA:  I have reviewed the data as listed Results for orders placed in visit on 08/17/13 (from the past 48 hour(s))  CBC WITH DIFFERENTIAL     Status: Abnormal   Collection Time    08/17/13 12:19 PM      Result Value Range   WBC 4.7  4.0 - 10.3 10e3/uL   NEUT# 3.2  1.5 - 6.5 10e3/uL   HGB 13.6  13.0 - 17.1 g/dL   HCT 16.1  09.6 - 04.5 %   Platelets 85 (*) 140 - 400 10e3/uL   MCV 82.9  79.3 - 98.0 fL   MCH 29.0  27.2 - 33.4 pg   MCHC 35.0  32.0 - 36.0 g/dL   RBC 4.09  8.11 - 9.14 10e6/uL   RDW 12.6  11.0 - 14.6 %   lymph# 0.9  0.9 - 3.3 10e3/uL   MONO# 0.4  0.1 - 0.9 10e3/uL   Eosinophils Absolute 0.2  0.0 - 0.5 10e3/uL   Basophils Absolute 0.0  0.0 - 0.1 10e3/uL   NEUT% 67.4  39.0 - 75.0 %   LYMPH% 19.1  14.0 - 49.0 %   MONO% 8.7  0.0 - 14.0 %   EOS% 4.2  0.0 - 7.0 %   BASO% 0.6  0.0 - 2.0 %  COMPREHENSIVE METABOLIC PANEL (CC13)     Status: Abnormal   Collection Time    08/17/13 12:19 PM      Result  Value Range   Sodium 139  136 - 145 mEq/L   Potassium 4.2  3.5 - 5.1 mEq/L   Chloride 105  98 - 109 mEq/L   CO2 25  22 - 29 mEq/L   Glucose 106  70 - 140 mg/dl   BUN 96.0  7.0 - 45.4 mg/dL   Creatinine 1.4 (*) 0.7 - 1.3 mg/dL   Total Bilirubin 0.98  0.20 - 1.20 mg/dL   Alkaline Phosphatase 53  40 - 150 U/L   AST 29  5 - 34 U/L   ALT 35  0 - 55 U/L   Total Protein 7.1  6.4 - 8.3 g/dL   Albumin 3.8  3.5 - 5.0 g/dL   Calcium 9.8  8.4 - 11.9 mg/dL   Anion Gap 9  3 - 11 mEq/L      RADIOGRAPHIC STUDIES: I have personally reviewed the radiological images as listed and agreed with the findings in the report. I reviewed his imaging study from 2011 which showed bilateral basilar atelectasis ASSESSMENT:  Chronic ITP  PLAN:  #1 chronic ITP He has no evidence of signs and symptoms of bleeding. We will continue observation with history, physical examination, and yearly blood work. The patient is educated about signs and symptoms to watch for bruising and bleeding. #2 history of lung nodules The patient is asymptomatic. He is not a smoker. I recommend consideration of repeat imaging study in the future. All questions were answered. The patient knows to call the clinic with any problems, questions or concerns. No barriers to learning was detected.  I spent 15 minutes counseling the patient face to face. The total time spent in the appointment was 20 minutes and more than 50% was on counseling.     [facility], Tayler Lassen, MD 08/17/2013 2:20 PM

## 2013-08-17 NOTE — Telephone Encounter (Signed)
per 10/27 POF appts made for 07/2014 sw pt and mailed cal shh

## 2013-08-27 ENCOUNTER — Other Ambulatory Visit: Payer: Self-pay

## 2014-01-06 ENCOUNTER — Other Ambulatory Visit (HOSPITAL_COMMUNITY): Payer: Self-pay | Admitting: Orthopedic Surgery

## 2014-01-06 ENCOUNTER — Encounter (HOSPITAL_COMMUNITY): Payer: Self-pay | Admitting: Pharmacy Technician

## 2014-01-07 ENCOUNTER — Encounter (HOSPITAL_COMMUNITY): Payer: Self-pay | Admitting: *Deleted

## 2014-01-07 MED ORDER — CHLORHEXIDINE GLUCONATE 4 % EX LIQD
60.0000 mL | Freq: Once | CUTANEOUS | Status: DC
  Filled 2014-01-07: qty 60

## 2014-01-07 MED ORDER — CEFAZOLIN SODIUM-DEXTROSE 2-3 GM-% IV SOLR
2.0000 g | INTRAVENOUS | Status: AC
  Administered 2014-01-08: 2 g via INTRAVENOUS

## 2014-01-07 NOTE — H&P (Signed)
Phillip Morris is an 58 y.o. male.   Chief Complaint: Right shoulder pain and deformity HPI: Phillip Morris is a 58 year old patient who was shoveling snow approximately 2 weeks ago. Developed immediate onset of pain in the shoulder and noticed deformity. He was seen in clinic approximately 3 days ago and noted to have proximal biceps tendon rupture with resulting deformity. He has attempted to use the arm but reports some cramping type pain. He's had no problems with the right shoulder before.  Past Medical History  Diagnosis Date  . ITP (idiopathic thrombocytopenic purpura)   . Basal cell carcinoma 1990  . Squamous cell carcinoma     of skin  . Hypertension   . Hyperlipemia   . Inguinal hernia     left    Past Surgical History  Procedure Laterality Date  . Nasal septum surgery    . Eye surgery Bilateral     lasik  . Colonoscopy      Family History  Problem Relation Age of Onset  . Diabetes type II Mother   . Hypertension Mother   . CAD Mother   . Alzheimer's disease Mother   . Cancer - Prostate Father    Social History:  reports that he quit smoking about 35 years ago. He has never used smokeless tobacco. He reports that he drinks alcohol. He reports that he does not use illicit drugs.  Allergies:  Allergies  Allergen Reactions  . Aspirin Other (See Comments)    Patient has ITP syndrome  . Atorvastatin Other (See Comments)    Memory loss    No prescriptions prior to admission    No results found for this or any previous visit (from the past 48 hour(s)). No results found.  Review of Systems  Constitutional: Negative.   HENT: Negative.   Eyes: Negative.   Respiratory: Negative.   Cardiovascular: Negative.   Gastrointestinal: Negative.   Genitourinary: Negative.   Musculoskeletal: Positive for joint pain.  Skin: Negative.   Neurological: Negative.   Endo/Heme/Allergies: Negative.   Psychiatric/Behavioral: Negative.     There were no vitals taken for this  visit. Physical Exam  Constitutional: He appears well-developed.  HENT:  Head: Normocephalic.  Eyes: Pupils are equal, round, and reactive to light.  Neck: Normal range of motion.  Cardiovascular: Normal rate.   Respiratory: Effort normal.  GI: Soft.  Neurological: He is alert.  Skin: Skin is warm.  Psychiatric: He has a normal mood and affect.   examination of the right upper extremity demonstrates Popeye deformity in the arm elbow range of motion full he has good rotator cuff strength and I don't detect much in the way of coarse granular crepitus with active or passive range of motion of the arm. Never strictured extra rotation 15 abduction impingement signs a code on the right negative on the left no a.c. joint tenderness direct palpation   Assessment/Plan Impression is right distal biceps rupture. This may may not be associated with the rotator cuff tear. On exam he has good strength and no coarse grinding or crepitus. He would like to have the biceps tendon rupture repair. I think it is feasible at 2 weeks. The risk and benefits of surgical intervention are discussed with the patient including not limited to infection nerve vessel damage also rerupture the biceps tendon possibility that we may not be notify the tendon edge and pulled back up to have it tenodesis and a subpectoral manner. There is also a possibility that at the time of  arthroscopy rotator cuff tear can be identified which would need to be repaired. Patient understands the risk and benefits of surgery. This is complicated to some degree by his ITP. Plan is for arthroscopy subacromial decompression evaluation the rotator cuff and biceps tenodesis with possible rotator cuff repair risk and benefits are discussed with the patient we will and to infection nerve vessel damage incomplete healing stiffness of the shoulder all questions answered  DEAN,GREGORY SCOTT 01/07/2014, 10:12 PM

## 2014-01-08 ENCOUNTER — Encounter (HOSPITAL_COMMUNITY): Payer: Self-pay | Admitting: *Deleted

## 2014-01-08 ENCOUNTER — Ambulatory Visit (HOSPITAL_COMMUNITY): Payer: BC Managed Care – PPO

## 2014-01-08 ENCOUNTER — Ambulatory Visit (HOSPITAL_COMMUNITY)
Admission: RE | Admit: 2014-01-08 | Discharge: 2014-01-08 | Disposition: A | Discharge status: Home / Self Care | Payer: BC Managed Care – PPO | Source: Ambulatory Visit | Attending: Orthopedic Surgery | Admitting: Orthopedic Surgery

## 2014-01-08 ENCOUNTER — Encounter (HOSPITAL_COMMUNITY): Payer: BC Managed Care – PPO | Admitting: Anesthesiology

## 2014-01-08 ENCOUNTER — Ambulatory Visit (HOSPITAL_COMMUNITY): Payer: BC Managed Care – PPO | Admitting: Anesthesiology

## 2014-01-08 ENCOUNTER — Encounter (HOSPITAL_COMMUNITY)
Admission: RE | Disposition: A | Discharge status: Home / Self Care | Payer: Self-pay | Source: Ambulatory Visit | Attending: Orthopedic Surgery

## 2014-01-08 DIAGNOSIS — K4090 Unilateral inguinal hernia, without obstruction or gangrene, not specified as recurrent: Secondary | ICD-10-CM | POA: Insufficient documentation

## 2014-01-08 DIAGNOSIS — Y93H1 Activity, digging, shoveling and raking: Secondary | ICD-10-CM | POA: Insufficient documentation

## 2014-01-08 DIAGNOSIS — Z87891 Personal history of nicotine dependence: Secondary | ICD-10-CM | POA: Insufficient documentation

## 2014-01-08 DIAGNOSIS — Z85828 Personal history of other malignant neoplasm of skin: Secondary | ICD-10-CM | POA: Insufficient documentation

## 2014-01-08 DIAGNOSIS — X500XXA Overexertion from strenuous movement or load, initial encounter: Secondary | ICD-10-CM | POA: Insufficient documentation

## 2014-01-08 DIAGNOSIS — M67919 Unspecified disorder of synovium and tendon, unspecified shoulder: Secondary | ICD-10-CM | POA: Insufficient documentation

## 2014-01-08 DIAGNOSIS — I10 Essential (primary) hypertension: Secondary | ICD-10-CM | POA: Insufficient documentation

## 2014-01-08 DIAGNOSIS — S46819A Strain of other muscles, fascia and tendons at shoulder and upper arm level, unspecified arm, initial encounter: Principal | ICD-10-CM

## 2014-01-08 DIAGNOSIS — S46211A Strain of muscle, fascia and tendon of other parts of biceps, right arm, initial encounter: Secondary | ICD-10-CM

## 2014-01-08 DIAGNOSIS — S43499A Other sprain of unspecified shoulder joint, initial encounter: Secondary | ICD-10-CM | POA: Insufficient documentation

## 2014-01-08 DIAGNOSIS — M719 Bursopathy, unspecified: Secondary | ICD-10-CM | POA: Insufficient documentation

## 2014-01-08 DIAGNOSIS — D693 Immune thrombocytopenic purpura: Secondary | ICD-10-CM | POA: Insufficient documentation

## 2014-01-08 DIAGNOSIS — E785 Hyperlipidemia, unspecified: Secondary | ICD-10-CM | POA: Insufficient documentation

## 2014-01-08 DIAGNOSIS — S43439A Superior glenoid labrum lesion of unspecified shoulder, initial encounter: Secondary | ICD-10-CM | POA: Insufficient documentation

## 2014-01-08 HISTORY — DX: Unilateral inguinal hernia, without obstruction or gangrene, not specified as recurrent: K40.90

## 2014-01-08 HISTORY — PX: SHOULDER ARTHROSCOPY WITH SUBACROMIAL DECOMPRESSION, ROTATOR CUFF REPAIR AND BICEP TENDON REPAIR: SHX5687

## 2014-01-08 LAB — CBC
HCT: 40.2 % (ref 39.0–52.0)
Hemoglobin: 14.3 g/dL (ref 13.0–17.0)
MCH: 29.5 pg (ref 26.0–34.0)
MCHC: 35.6 g/dL (ref 30.0–36.0)
MCV: 82.9 fL (ref 78.0–100.0)
PLATELETS: 81 10*3/uL — AB (ref 150–400)
RBC: 4.85 MIL/uL (ref 4.22–5.81)
RDW: 12.6 % (ref 11.5–15.5)
WBC: 6 10*3/uL (ref 4.0–10.5)

## 2014-01-08 LAB — BASIC METABOLIC PANEL
BUN: 17 mg/dL (ref 6–23)
CALCIUM: 9.6 mg/dL (ref 8.4–10.5)
CO2: 26 meq/L (ref 19–32)
CREATININE: 1.46 mg/dL — AB (ref 0.50–1.35)
Chloride: 103 mEq/L (ref 96–112)
GFR calc Af Amer: 60 mL/min — ABNORMAL LOW (ref >90)
GFR, EST NON AFRICAN AMERICAN: 52 mL/min — AB (ref >90)
GLUCOSE: 107 mg/dL — AB (ref 70–99)
Potassium: 4.3 mEq/L (ref 3.7–5.3)
SODIUM: 141 meq/L (ref 137–147)

## 2014-01-08 SURGERY — SHOULDER ARTHROSCOPY WITH SUBACROMIAL DECOMPRESSION, ROTATOR CUFF REPAIR AND BICEP TENDON REPAIR
Anesthesia: General | Site: Shoulder | Laterality: Right

## 2014-01-08 MED ORDER — ARTIFICIAL TEARS OP OINT
TOPICAL_OINTMENT | OPHTHALMIC | Status: AC
  Filled 2014-01-08: qty 3.5

## 2014-01-08 MED ORDER — FENTANYL CITRATE 0.05 MG/ML IJ SOLN
INTRAMUSCULAR | Status: AC
Start: 2014-01-08 — End: 2014-01-08
  Administered 2014-01-08: 100 ug via INTRAVENOUS
  Filled 2014-01-08: qty 2

## 2014-01-08 MED ORDER — EPINEPHRINE HCL 1 MG/ML IJ SOLN
INTRAMUSCULAR | Status: DC | PRN
  Administered 2014-01-08: .15 mL

## 2014-01-08 MED ORDER — EPINEPHRINE HCL 1 MG/ML IJ SOLN
INTRAMUSCULAR | Status: AC
  Filled 2014-01-08: qty 1

## 2014-01-08 MED ORDER — MIDAZOLAM HCL 2 MG/2ML IJ SOLN
INTRAMUSCULAR | Status: AC
Start: 2014-01-08 — End: 2014-01-08
  Administered 2014-01-08: 2 mg via INTRAVENOUS
  Filled 2014-01-08: qty 2

## 2014-01-08 MED ORDER — BUPIVACAINE HCL (PF) 0.25 % IJ SOLN
INTRAMUSCULAR | Status: DC | PRN
  Administered 2014-01-08: 10 mL

## 2014-01-08 MED ORDER — PROPOFOL 10 MG/ML IV BOLUS
INTRAVENOUS | Status: AC
  Filled 2014-01-08: qty 20

## 2014-01-08 MED ORDER — OXYCODONE-ACETAMINOPHEN 5-325 MG PO TABS: 1.0000 | ORAL_TABLET | ORAL | Status: DC | PRN

## 2014-01-08 MED ORDER — ONDANSETRON HCL 4 MG/2ML IJ SOLN
INTRAMUSCULAR | Status: DC | PRN
  Administered 2014-01-08: 4 mg via INTRAVENOUS

## 2014-01-08 MED ORDER — ONDANSETRON HCL 4 MG/2ML IJ SOLN
INTRAMUSCULAR | Status: AC
  Filled 2014-01-08: qty 2

## 2014-01-08 MED ORDER — MIDAZOLAM HCL 2 MG/2ML IJ SOLN
INTRAMUSCULAR | Status: AC
  Filled 2014-01-08: qty 2

## 2014-01-08 MED ORDER — BUPIVACAINE HCL (PF) 0.25 % IJ SOLN
INTRAMUSCULAR | Status: AC
  Filled 2014-01-08: qty 30

## 2014-01-08 MED ORDER — NEOSTIGMINE METHYLSULFATE 1 MG/ML IJ SOLN
INTRAMUSCULAR | Status: DC | PRN
  Administered 2014-01-08: 3 mg via INTRAVENOUS

## 2014-01-08 MED ORDER — ROCURONIUM BROMIDE 100 MG/10ML IV SOLN
INTRAVENOUS | Status: DC | PRN
  Administered 2014-01-08: 50 mg via INTRAVENOUS

## 2014-01-08 MED ORDER — PHENYLEPHRINE HCL 10 MG/ML IJ SOLN
INTRAMUSCULAR | Status: DC | PRN
  Administered 2014-01-08 (×5): 80 ug via INTRAVENOUS

## 2014-01-08 MED ORDER — PHENYLEPHRINE 40 MCG/ML (10ML) SYRINGE FOR IV PUSH (FOR BLOOD PRESSURE SUPPORT)
PREFILLED_SYRINGE | INTRAVENOUS | Status: AC
  Filled 2014-01-08: qty 10

## 2014-01-08 MED ORDER — ROCURONIUM BROMIDE 50 MG/5ML IV SOLN
INTRAVENOUS | Status: AC
  Filled 2014-01-08: qty 1

## 2014-01-08 MED ORDER — SODIUM CHLORIDE 0.9 % IJ SOLN
INTRAMUSCULAR | Status: AC
  Filled 2014-01-08: qty 3

## 2014-01-08 MED ORDER — CEFAZOLIN SODIUM-DEXTROSE 2-3 GM-% IV SOLR
INTRAVENOUS | Status: AC
  Filled 2014-01-08: qty 50

## 2014-01-08 MED ORDER — LACTATED RINGERS IV SOLN
INTRAVENOUS | Status: DC
  Administered 2014-01-08 (×2): via INTRAVENOUS

## 2014-01-08 MED ORDER — ONDANSETRON HCL 4 MG/2ML IJ SOLN: 4.0000 mg | Freq: Once | INTRAMUSCULAR | Status: DC | PRN

## 2014-01-08 MED ORDER — OXYCODONE HCL 5 MG/5ML PO SOLN: 5.0000 mg | Freq: Once | ORAL | Status: DC | PRN

## 2014-01-08 MED ORDER — NEOSTIGMINE METHYLSULFATE 1 MG/ML IJ SOLN
INTRAMUSCULAR | Status: AC
  Filled 2014-01-08: qty 10

## 2014-01-08 MED ORDER — HYDROMORPHONE HCL PF 1 MG/ML IJ SOLN: 0.2500 mg | INTRAMUSCULAR | Status: DC | PRN

## 2014-01-08 MED ORDER — LIDOCAINE HCL (CARDIAC) 20 MG/ML IV SOLN
INTRAVENOUS | Status: DC | PRN
  Administered 2014-01-08: 60 mg via INTRAVENOUS

## 2014-01-08 MED ORDER — FENTANYL CITRATE 0.05 MG/ML IJ SOLN
INTRAMUSCULAR | Status: DC | PRN
  Administered 2014-01-08: 100 ug via INTRAVENOUS

## 2014-01-08 MED ORDER — SODIUM CHLORIDE 0.9 % IJ SOLN
INTRAMUSCULAR | Status: AC
  Filled 2014-01-08: qty 12

## 2014-01-08 MED ORDER — GLYCOPYRROLATE 0.2 MG/ML IJ SOLN
INTRAMUSCULAR | Status: AC
  Filled 2014-01-08: qty 2

## 2014-01-08 MED ORDER — PROPOFOL 10 MG/ML IV BOLUS
INTRAVENOUS | Status: DC | PRN
  Administered 2014-01-08: 150 mg via INTRAVENOUS

## 2014-01-08 MED ORDER — OXYCODONE HCL 5 MG PO TABS: 5.0000 mg | ORAL_TABLET | Freq: Once | ORAL | Status: DC | PRN

## 2014-01-08 MED ORDER — METHOCARBAMOL 500 MG PO TABS: 500.0000 mg | ORAL_TABLET | Freq: Four times a day (QID) | ORAL | Status: DC | PRN

## 2014-01-08 MED ORDER — FENTANYL CITRATE 0.05 MG/ML IJ SOLN
INTRAMUSCULAR | Status: AC
  Filled 2014-01-08: qty 5

## 2014-01-08 MED ORDER — SODIUM CHLORIDE 0.9 % IJ SOLN
INTRAMUSCULAR | Status: DC | PRN
  Administered 2014-01-08: 50 mL

## 2014-01-08 MED ORDER — SODIUM CHLORIDE 0.9 % IR SOLN
Status: DC | PRN
  Administered 2014-01-08 (×2): 3000 mL

## 2014-01-08 MED ORDER — GLYCOPYRROLATE 0.2 MG/ML IJ SOLN
INTRAMUSCULAR | Status: DC | PRN
  Administered 2014-01-08: 0.4 mg via INTRAVENOUS

## 2014-01-08 SURGICAL SUPPLY — 73 items
BENZOIN TINCTURE PRP APPL 2/3 (GAUZE/BANDAGES/DRESSINGS) ×3 IMPLANT
BIT DRILL TAK (DRILL) IMPLANT
BLADE CUDA 5.5 (BLADE) IMPLANT
BLADE CUTTER GATOR 3.5 (BLADE) ×3 IMPLANT
BLADE GREAT WHITE 4.2 (BLADE) ×2 IMPLANT
BLADE GREAT WHITE 4.2MM (BLADE) ×1
BLADE SURG 11 STRL SS (BLADE) ×3 IMPLANT
BUR GATOR 2.9 (BURR) IMPLANT
BUR GATOR 2.9MM (BURR)
BUR OVAL 6.0 (BURR) ×3 IMPLANT
CANNULA SHOULDER 7CM (CANNULA) IMPLANT
CARTRIDGE CURVETEK MED (MISCELLANEOUS) IMPLANT
CARTRIDGE CURVETEK XLRG (MISCELLANEOUS) IMPLANT
CLOSURE STERI-STRIP 1/2X4 (GAUZE/BANDAGES/DRESSINGS) ×1
CLOSURE WOUND 1/2 X4 (GAUZE/BANDAGES/DRESSINGS) ×1
CLSR STERI-STRIP ANTIMIC 1/2X4 (GAUZE/BANDAGES/DRESSINGS) ×2 IMPLANT
COVER SURGICAL LIGHT HANDLE (MISCELLANEOUS) ×3 IMPLANT
DRAPE INCISE IOBAN 66X45 STRL (DRAPES) ×6 IMPLANT
DRAPE STERI 35X30 U-POUCH (DRAPES) ×3 IMPLANT
DRAPE U-SHAPE 47X51 STRL (DRAPES) ×6 IMPLANT
DRILL TAK (DRILL)
DRSG PAD ABDOMINAL 8X10 ST (GAUZE/BANDAGES/DRESSINGS) ×9 IMPLANT
DURAPREP 26ML APPLICATOR (WOUND CARE) ×3 IMPLANT
ELECT MENISCUS 165MM 90D (ELECTRODE) IMPLANT
ELECT REM PT RETURN 9FT ADLT (ELECTROSURGICAL) ×3
ELECTRODE REM PT RTRN 9FT ADLT (ELECTROSURGICAL) ×1 IMPLANT
FILTER STRAW FLUID ASPIR (MISCELLANEOUS) ×3 IMPLANT
GAUZE XEROFORM 1X8 LF (GAUZE/BANDAGES/DRESSINGS) ×3 IMPLANT
GLOVE BIO SURGEON ST LM GN SZ9 (GLOVE) ×3 IMPLANT
GLOVE BIOGEL PI IND STRL 8 (GLOVE) ×1 IMPLANT
GLOVE BIOGEL PI INDICATOR 8 (GLOVE) ×2
GLOVE SURG ORTHO 8.0 STRL STRW (GLOVE) ×3 IMPLANT
GOWN STRL REUS W/ TWL LRG LVL3 (GOWN DISPOSABLE) ×3 IMPLANT
GOWN STRL REUS W/TWL LRG LVL3 (GOWN DISPOSABLE) ×6
KIT BASIN OR (CUSTOM PROCEDURE TRAY) ×3 IMPLANT
KIT ROOM TURNOVER OR (KITS) ×3 IMPLANT
MANIFOLD NEPTUNE II (INSTRUMENTS) ×3 IMPLANT
NDL SUT 6 .5 CRC .975X.05 MAYO (NEEDLE) ×1 IMPLANT
NEEDLE HYPO 25X1 1.5 SAFETY (NEEDLE) ×3 IMPLANT
NEEDLE MAYO TAPER (NEEDLE) ×2
NEEDLE SPNL 18GX3.5 QUINCKE PK (NEEDLE) ×3 IMPLANT
NS IRRIG 1000ML POUR BTL (IV SOLUTION) ×3 IMPLANT
PACK SHOULDER (CUSTOM PROCEDURE TRAY) ×3 IMPLANT
PAD ARMBOARD 7.5X6 YLW CONV (MISCELLANEOUS) ×6 IMPLANT
SET ARTHROSCOPY TUBING (MISCELLANEOUS) ×2
SET ARTHROSCOPY TUBING LN (MISCELLANEOUS) ×1 IMPLANT
SLING ARM IMMOBILIZER MED (SOFTGOODS) IMPLANT
SPEAR FASTAKII (SLEEVE) IMPLANT
SPONGE GAUZE 4X4 12PLY (GAUZE/BANDAGES/DRESSINGS) ×3 IMPLANT
SPONGE GAUZE 4X4 12PLY STER LF (GAUZE/BANDAGES/DRESSINGS) ×3 IMPLANT
SPONGE LAP 4X18 X RAY DECT (DISPOSABLE) ×6 IMPLANT
STRIP CLOSURE SKIN 1/2X4 (GAUZE/BANDAGES/DRESSINGS) ×2 IMPLANT
SUCTION FRAZIER TIP 10 FR DISP (SUCTIONS) ×3 IMPLANT
SUT ETHILON 3 0 PS 1 (SUTURE) ×3 IMPLANT
SUT FIBERWIRE 2-0 18 17.9 3/8 (SUTURE)
SUT PROLENE 3 0 PS 2 (SUTURE) ×3 IMPLANT
SUT VIC AB 0 CT1 27 (SUTURE) ×4
SUT VIC AB 0 CT1 27XBRD ANBCTR (SUTURE) ×2 IMPLANT
SUT VIC AB 1 CT1 27 (SUTURE) ×2
SUT VIC AB 1 CT1 27XBRD ANBCTR (SUTURE) ×1 IMPLANT
SUT VIC AB 2-0 CT1 27 (SUTURE) ×2
SUT VIC AB 2-0 CT1 TAPERPNT 27 (SUTURE) ×1 IMPLANT
SUT VICRYL 0 UR6 27IN ABS (SUTURE) IMPLANT
SUTURE FIBERWR 2-0 18 17.9 3/8 (SUTURE) IMPLANT
SYR 20CC LL (SYRINGE) ×6 IMPLANT
SYR 3ML LL SCALE MARK (SYRINGE) ×3 IMPLANT
SYR TB 1ML LUER SLIP (SYRINGE) ×3 IMPLANT
SYSTEM TENDODESIS IMPLANT (Miscellaneous) ×3 IMPLANT
TAPE CLOTH SURG 6X10 WHT LF (GAUZE/BANDAGES/DRESSINGS) ×3 IMPLANT
TOWEL OR 17X24 6PK STRL BLUE (TOWEL DISPOSABLE) ×3 IMPLANT
TOWEL OR 17X26 10 PK STRL BLUE (TOWEL DISPOSABLE) ×3 IMPLANT
WAND 90 DEG TURBOVAC W/CORD (SURGICAL WAND) IMPLANT
WATER STERILE IRR 1000ML POUR (IV SOLUTION) ×3 IMPLANT

## 2014-01-08 NOTE — Anesthesia Postprocedure Evaluation (Signed)
  Anesthesia Post-op Note  Patient: Phillip Morris  Procedure(s) Performed: Procedure(s) with comments: SHOULDER ARTHROSCOPY WITH SUBACROMIAL DECOMPRESSION, ROTATOR CUFF REPAIR AND BICEP TENDON REPAIR (Right) - RIGHT SHOULDER DIAGNOSTIC OPERATIVE ARTHROSCOPY, SUBACROMIAL DECOMPRESSION, POSSIBLE ROTATOR CUFF REPAIR, BICEPS TENODESIS.  Patient Location: PACU  Anesthesia Type:General and Regional  Level of Consciousness: awake, alert  and oriented  Airway and Oxygen Therapy: Patient Spontanous Breathing  Post-op Pain: none  Post-op Assessment: Post-op Vital signs reviewed, Patient's Cardiovascular Status Stable, Respiratory Function Stable, Patent Airway, No signs of Nausea or vomiting and Pain level controlled  Post-op Vital Signs: Reviewed and stable  Complications: No apparent anesthesia complications

## 2014-01-08 NOTE — Progress Notes (Signed)
Patient contacted, informed of Dr. Forbes Cellar Orders.

## 2014-01-08 NOTE — Progress Notes (Signed)
Attempted to call Patient.  Dr. Marlou Sa elaborated on previous orders.  Patient should do exercises 30 reps, 3 times per day out of the sling

## 2014-01-08 NOTE — Brief Op Note (Signed)
01/08/2014  2:21 PM  PATIENT:  Phillip Morris  58 y.o. male  PRE-OPERATIVE DIAGNOSIS:  RIGHT SHOULDER BICEPS RUPTURE, POSSIBLE ROTATOR CUFF TEAR  POST-OPERATIVE DIAGNOSIS:  RIGHT SHOULDER BICEPS RUPTURE, POSSIBLE ROTATOR CUFF TEAR  PROCEDURE:  Right shoulder arthroscopy limited debridement of labrum and biceps tendon stump subacromial decompression open biceps tenodesis  SURGEON:  Surgeon(s) and Role:    * Meredith Pel, MD - Primary  PHYSICIAN ASSISTANT: Phillips Hay PA  ASSISTANTS: none   ANESTHESIA:   general  EBL:  Total I/O In: 1500 [I.V.:1500] Out: 75 [Blood:75]  BLOOD ADMINISTERED:none  DRAINS: none   LOCAL MEDICATIONS USED:  NONE  SPECIMEN:  No Specimen  DISPOSITION OF SPECIMEN:  N/A  COUNTS:  YES  TOURNIQUET:  * No tourniquets in log *  DICTATION: .Viviann Spare [phone]  PLAN OF CARE: Discharge to home after PACU  PATIENT DISPOSITION:  PACU - hemodynamically stable.   Delay start of Pharmacological VTE agent (>24hrs) due to surgical blood loss or risk of bleeding: not applicable

## 2014-01-08 NOTE — Interval H&P Note (Signed)
History and Physical Interval Note:  01/08/2014 6:14 AM  Phillip Morris  has presented today for surgery, with the diagnosis of RIGHT SHOULDER BICEPS RUPTURE, POSSIBLE ROTATOR CUFF TEAR  The various methods of treatment have been discussed with the patient and family. After consideration of risks, benefits and other options for treatment, the patient has consented to  Procedure(s) with comments: SHOULDER ARTHROSCOPY WITH SUBACROMIAL DECOMPRESSION, ROTATOR CUFF REPAIR AND BICEP TENDON REPAIR (Right) - RIGHT SHOULDER DIAGNOSTIC OPERATIVE ARTHROSCOPY, SUBACROMIAL DECOMPRESSION, POSSIBLE ROTATOR CUFF REPAIR, BICEPS TENODESIS. as a surgical intervention .  The patient's history has been reviewed, patient examined, no change in status, stable for surgery.  I have reviewed the patient's chart and labs.  Questions were answered to the patient's satisfaction.     DEAN,GREGORY SCOTT

## 2014-01-08 NOTE — Preoperative (Signed)
Beta Blockers   Reason not to administer Beta Blockers:Not Applicable 

## 2014-01-08 NOTE — Discharge Instructions (Signed)
Change dressing in 3 days and apply clean dressing to wounds.  Large bandaids are adequate for the wounds.  Keep wounds dry and clean until office visit. Ice packs may be used as needed to shoulder.  Wear shoulder immobilizer full time and remove to do Pendulum Exercises 3 times daily. Move elbow, wrist and hand frequently to prevent edema.  What to eat:  For your first meals, you should eat lightly; only small meals initially.  If you do not have nausea, you may eat larger meals.  Avoid spicy, greasy and heavy food.    General Anesthesia, Adult, Care After  Refer to this sheet in the next few weeks. These instructions provide you with information on caring for yourself after your procedure. Your health care provider may also give you more specific instructions. Your treatment has been planned according to current medical practices, but problems sometimes occur. Call your health care provider if you have any problems or questions after your procedure.  WHAT TO EXPECT AFTER THE PROCEDURE  After the procedure, it is typical to experience:  Sleepiness.  Nausea and vomiting. HOME CARE INSTRUCTIONS  For the first 24 hours after general anesthesia:  Have a responsible person with you.  Do not drive a car. If you are alone, do not take public transportation.  Do not drink alcohol.  Do not take medicine that has not been prescribed by your health care provider.  Do not sign important papers or make important decisions.  You may resume a normal diet and activities as directed by your health care provider.  Change bandages (dressings) as directed.  If you have questions or problems that seem related to general anesthesia, call the hospital and ask for the anesthetist or anesthesiologist on call. SEEK MEDICAL CARE IF:  You have nausea and vomiting that continue the day after anesthesia.  You develop a rash. SEEK IMMEDIATE MEDICAL CARE IF:  You have difficulty breathing.  You have chest pain.  You  have any allergic problems. Document Released: 01/14/2001 Document Revised: 06/10/2013 Document Reviewed: 04/23/2013  [facility] Patient Information 2014 [place], [place].

## 2014-01-08 NOTE — Anesthesia Procedure Notes (Addendum)
Anesthesia Regional Block:  Interscalene brachial plexus block  Pre-Anesthetic Checklist: ,, timeout performed, Correct Patient, Correct Site, Correct Laterality, Correct Procedure, Correct Position, site marked, Risks and benefits discussed,  Surgical consent,  Pre-op evaluation,  At surgeon's request and post-op pain management  Laterality: Right  Prep: chloraprep       Needles:  Injection technique: Single-shot  Needle Type: Echogenic Stimulator Needle     Needle Length: 9cm 9 cm Needle Gauge: 22 and 22 G    Additional Needles:  Procedures: ultrasound guided (picture in chart) and nerve stimulator Interscalene brachial plexus block Narrative:  Start time: 01/08/2014 10:20 AM End time: 01/08/2014 10:25 AM Injection made incrementally with aspirations every 5 mL.  Performed by: Personally   Additional Notes: 20 cc 0.5% marcaine 1:200 Epi with 6 mg decadron injected easily   Procedure Name: Intubation Date/Time: 01/08/2014 11:00 AM Performed by: Julian Reil Pre-anesthesia Checklist: Patient identified, Emergency Drugs available, Suction available and Patient being monitored Patient Re-evaluated:Patient Re-evaluated prior to inductionOxygen Delivery Method: Circle system utilized Preoxygenation: Pre-oxygenation with 100% oxygen Intubation Type: IV induction Ventilation: Mask ventilation without difficulty Laryngoscope Size: Mac and 4 Grade View: Grade II Tube type: Oral Tube size: 7.5 mm Number of attempts: 1 Airway Equipment and Method: Stylet Placement Confirmation: ETT inserted through vocal cords under direct vision,  positive ETCO2 and breath sounds checked- equal and bilateral Secured at: 21 cm Tube secured with: Tape Dental Injury: Teeth and Oropharynx as per pre-operative assessment

## 2014-01-08 NOTE — Anesthesia Preprocedure Evaluation (Addendum)
Anesthesia Evaluation  Patient identified by MRN, date of birth, ID band Patient awake    Reviewed: Allergy & Precautions, H&P , NPO status   Airway Mallampati: II TM Distance: >3 FB Neck ROM: Full    Dental  (+) Teeth Intact, Dental Advisory Given   Pulmonary former smoker,  breath sounds clear to auscultation        Cardiovascular hypertension, Rhythm:Regular Rate:Normal     Neuro/Psych    GI/Hepatic   Endo/Other    Renal/GU      Musculoskeletal   Abdominal   Peds  Hematology   Anesthesia Other Findings   Reproductive/Obstetrics                          Anesthesia Physical Anesthesia Plan  ASA: II  Anesthesia Plan: General   Post-op Pain Management:    Induction: Intravenous  Airway Management Planned: Oral ETT  Additional Equipment:   Intra-op Plan:   Post-operative Plan: Extubation in OR  Informed Consent: I have reviewed the patients History and Physical, chart, labs and discussed the procedure including the risks, benefits and alternatives for the proposed anesthesia with the patient or authorized representative who has indicated his/her understanding and acceptance.   Dental advisory given  Plan Discussed with: CRNA and Anesthesiologist  Anesthesia Plan Comments: (Impingement R. Shoulder ITP stable on no treatment Platelet count 80,000, no bleeding history Htn  Plan GA with interscalene block  Roberts Gaudy, MD)        Anesthesia Quick Evaluation

## 2014-01-08 NOTE — Transfer of Care (Signed)
Immediate Anesthesia Transfer of Care Note  Patient: Phillip Morris  Procedure(s) Performed: Procedure(s) with comments: SHOULDER ARTHROSCOPY WITH SUBACROMIAL DECOMPRESSION, ROTATOR CUFF REPAIR AND BICEP TENDON REPAIR (Right) - RIGHT SHOULDER DIAGNOSTIC OPERATIVE ARTHROSCOPY, SUBACROMIAL DECOMPRESSION, POSSIBLE ROTATOR CUFF REPAIR, BICEPS TENODESIS.  Patient Location: PACU  Anesthesia Type:GA combined with regional for post-op pain  Level of Consciousness: awake, alert  and oriented  Airway & Oxygen Therapy: Patient Spontanous Breathing and Patient connected to face mask oxygen  Post-op Assessment: Report given to PACU RN  Post vital signs: Reviewed and stable  Complications: No apparent anesthesia complications

## 2014-01-09 NOTE — Op Note (Signed)
NAME:  Phillip Morris, Phillip Morris                 ACCOUNT NO.:  [id number]  MEDICAL RECORD NO.:  [id number]  LOCATION:  [facility]                         FACILITY:  [facility]  PHYSICIAN:  Anderson Malta, M.D.    DATE OF BIRTH:  1955/11/14  DATE OF PROCEDURE: DATE OF DISCHARGE:  01/08/2014                              OPERATIVE REPORT   PREOPERATIVE DIAGNOSIS:  Right shoulder biceps tendon rupture and bursitis.  POSTOPERATIVE DIAGNOSIS:  Right shoulder biceps tendon rupture and bursitis.  PROCEDURE:  Right shoulder diagnostic arthroscopy with limited debridement, intra-articular biceps tendon stump and labrum, limited debridement of partial-thickness rotator cuff tear, subacromial decompression, bursectomy, and open biceps tenodesis.  SURGEON:  Anderson Malta, MD  ASSISTANT:  Epimenio Foot, PA  ANESTHESIA:  General endotracheal.  ESTIMATED BLOOD LOSS:  Minimal.  INDICATIONS:  [name] is a patient who was shoveling snow 2 weeks ago and injured his right arm, has a palpable biceps deformity.  He has had some spasm type pain when he has been doing some activity, presents now for operative management after explanation of risks and benefits.  PROCEDURE IN DETAIL:  The patient was brought to the operating room where general endotracheal anesthesia was induced.  Preop antibiotics administered.  Time-out was called.  Right shoulder was examined under anesthesia, found to have full forward flexion to 180, abduction is 110. Shoulder stability was excellent 1+ in the anterior and posterior with less than a centimeter sulcus sign.  The patient was placed in a beach- chair position with the head in neutral position.  Right arm, shoulder, and hand was prescrubbed with alcohol and Betadine, which were allowed to air dry, prepped with DuraPrep solution and draped in a sterile manner.  Posterior portal was created.  Anterior portal created under direct visualization.  Diagnostic arthroscopy was performed.   The patient did have tearing of the biceps tendon.  A SLAP type tear was debrided with shaver and ArthroCare wand.  Partial-thickness rotator cuff tearing was present but it was not a full-thickness tear. __________ was then placed in subacromial space.  Bursectomy performed. Rotator cuff intact from the bursal side as well.  Subacromial decompression performed.  At this time, instruments removed from their portals which were closed using 3-0 nylon.  An incision was then made in the subpectoral area.  Biceps tendon was identified and placed under appropriate tension.  FiberWire grasping suture placed in the subpectoral.  Biceps tenodesis was performed using Arthrex intrahumeral locking bolt.  The incision was then thoroughly irrigated.  Instruments were removed.  Incision was closed using 3-0 Prolene.  The patient's shoulder placed in a sling, tolerated the procedure well without immediate complication.  Freda Munro Vernon's assistance was for all times during the case for opening, closing, retraction of neurovascular structures.  Her assistance was a medical necessity.     Anderson Malta, M.D.     GSD/MEDQ  D:  01/08/2014  T:  01/08/2014  Job:  [id number]

## 2014-01-15 ENCOUNTER — Encounter (HOSPITAL_COMMUNITY): Payer: Self-pay | Admitting: Orthopedic Surgery

## 2014-01-18 NOTE — Addendum Note (Signed)
Addendum created 01/18/14 2041 by Roberts Gaudy, MD   Modules edited: Anesthesia Attestations

## 2014-06-18 ENCOUNTER — Other Ambulatory Visit: Payer: Self-pay | Admitting: Internal Medicine

## 2014-06-18 DIAGNOSIS — N183 Chronic kidney disease, stage 3 unspecified: Secondary | ICD-10-CM

## 2014-06-23 ENCOUNTER — Ambulatory Visit
Admission: RE | Admit: 2014-06-23 | Discharge: 2014-06-23 | Disposition: A | Discharge status: Home / Self Care | Payer: BC Managed Care – PPO | Source: Ambulatory Visit | Attending: Internal Medicine | Admitting: Internal Medicine

## 2014-06-23 ENCOUNTER — Other Ambulatory Visit: Payer: Self-pay | Admitting: Gastroenterology

## 2014-06-23 DIAGNOSIS — N183 Chronic kidney disease, stage 3 unspecified: Secondary | ICD-10-CM

## 2014-06-24 ENCOUNTER — Encounter (HOSPITAL_COMMUNITY): Payer: Self-pay | Admitting: *Deleted

## 2014-06-24 ENCOUNTER — Encounter (HOSPITAL_COMMUNITY): Payer: Self-pay | Admitting: Pharmacy Technician

## 2014-07-05 ENCOUNTER — Ambulatory Visit (HOSPITAL_COMMUNITY)
Admission: RE | Admit: 2014-07-05 | Payer: BC Managed Care – PPO | Source: Ambulatory Visit | Admitting: Gastroenterology

## 2014-07-05 SURGERY — COLONOSCOPY WITH PROPOFOL
Anesthesia: Monitor Anesthesia Care

## 2014-07-26 ENCOUNTER — Other Ambulatory Visit: Payer: Self-pay | Admitting: Gastroenterology

## 2014-07-28 ENCOUNTER — Encounter (HOSPITAL_COMMUNITY): Payer: Self-pay | Admitting: *Deleted

## 2014-08-12 ENCOUNTER — Ambulatory Visit (HOSPITAL_COMMUNITY): Payer: BC Managed Care – PPO | Admitting: Anesthesiology

## 2014-08-12 ENCOUNTER — Encounter (HOSPITAL_COMMUNITY)
Admission: RE | Disposition: A | Discharge status: Home / Self Care | Payer: Self-pay | Source: Ambulatory Visit | Attending: Gastroenterology

## 2014-08-12 ENCOUNTER — Ambulatory Visit (HOSPITAL_COMMUNITY)
Admission: RE | Admit: 2014-08-12 | Discharge: 2014-08-12 | Disposition: A | Discharge status: Home / Self Care | Payer: BC Managed Care – PPO | Source: Ambulatory Visit | Attending: Gastroenterology | Admitting: Gastroenterology

## 2014-08-12 ENCOUNTER — Encounter (HOSPITAL_COMMUNITY): Payer: Self-pay | Admitting: *Deleted

## 2014-08-12 ENCOUNTER — Encounter (HOSPITAL_COMMUNITY): Payer: BC Managed Care – PPO | Admitting: Anesthesiology

## 2014-08-12 DIAGNOSIS — D693 Immune thrombocytopenic purpura: Secondary | ICD-10-CM | POA: Diagnosis not present

## 2014-08-12 DIAGNOSIS — E785 Hyperlipidemia, unspecified: Secondary | ICD-10-CM | POA: Diagnosis not present

## 2014-08-12 DIAGNOSIS — K635 Polyp of colon: Secondary | ICD-10-CM | POA: Insufficient documentation

## 2014-08-12 DIAGNOSIS — Z1211 Encounter for screening for malignant neoplasm of colon: Secondary | ICD-10-CM | POA: Diagnosis not present

## 2014-08-12 DIAGNOSIS — I10 Essential (primary) hypertension: Secondary | ICD-10-CM | POA: Diagnosis not present

## 2014-08-12 HISTORY — DX: Headache, unspecified: R51.9

## 2014-08-12 HISTORY — DX: Adverse effect of unspecified anesthetic, initial encounter: T41.45XA

## 2014-08-12 HISTORY — PX: COLONOSCOPY WITH PROPOFOL: SHX5780

## 2014-08-12 HISTORY — DX: Other complications of anesthesia, initial encounter: T88.59XA

## 2014-08-12 HISTORY — DX: Headache: R51

## 2014-08-12 SURGERY — COLONOSCOPY WITH PROPOFOL
Anesthesia: Monitor Anesthesia Care

## 2014-08-12 MED ORDER — FENTANYL CITRATE 0.05 MG/ML IJ SOLN
INTRAMUSCULAR | Status: AC
  Filled 2014-08-12: qty 2

## 2014-08-12 MED ORDER — LACTATED RINGERS IV SOLN
INTRAVENOUS | Status: DC
  Administered 2014-08-12: 1000 mL via INTRAVENOUS

## 2014-08-12 MED ORDER — SODIUM CHLORIDE 0.9 % IV SOLN: INTRAVENOUS | Status: DC

## 2014-08-12 MED ORDER — FENTANYL CITRATE 0.05 MG/ML IJ SOLN
INTRAMUSCULAR | Status: DC | PRN
  Administered 2014-08-12: 100 ug via INTRAVENOUS

## 2014-08-12 MED ORDER — PROPOFOL 10 MG/ML IV BOLUS
INTRAVENOUS | Status: DC | PRN
  Administered 2014-08-12: 30 mg via INTRAVENOUS

## 2014-08-12 MED ORDER — PROPOFOL 10 MG/ML IV BOLUS
INTRAVENOUS | Status: AC
  Filled 2014-08-12: qty 20

## 2014-08-12 MED ORDER — PROPOFOL INFUSION 10 MG/ML OPTIME
INTRAVENOUS | Status: DC | PRN
  Administered 2014-08-12: 200 ug/kg/min via INTRAVENOUS

## 2014-08-12 MED ORDER — LIDOCAINE HCL (CARDIAC) 20 MG/ML IV SOLN
INTRAVENOUS | Status: AC
  Filled 2014-08-12: qty 5

## 2014-08-12 SURGICAL SUPPLY — 21 items

## 2014-08-12 NOTE — Transfer of Care (Signed)
Immediate Anesthesia Transfer of Care Note  Patient: Phillip Morris  Procedure(s) Performed: Procedure(s): COLONOSCOPY WITH PROPOFOL (N/A)  Patient Location: PACU  Anesthesia Type:MAC  Level of Consciousness: sedated  Airway & Oxygen Therapy: Patient Spontanous Breathing and Patient connected to face mask oxygen  Post-op Assessment: Report given to PACU RN and Post -op Vital signs reviewed and stable  Post vital signs: Reviewed and stable  Complications: No apparent anesthesia complications

## 2014-08-12 NOTE — Discharge Instructions (Addendum)

## 2014-08-12 NOTE — H&P (Signed)
  Procedure: Surveillance colonoscopy. 12/26/2009 colonoscopy with removal of a 25 mm sessile serrated adenoma from the distal transverse colon.  History: The patient is a 58 year old male with idiopathic thrombocytopenic purpura. On 12/26/2009 he underwent a colonoscopy with removal of a 25 mm sessile serrated adenoma from the distal transverse colon. He is scheduled to undergo a surveillance colonoscopy today.  Past medical history: The biceps tendon repair. Rotator cuff surgery. Tonsillectomy. Nasal reconstruction surgery following a fracture. Hypertension. Hypercholesterolemia. He idiopathic thrombocytopenic purpura. Varicocele.  Exam: The patient is alert and lying comfortably on the endoscopy stretcher. Lungs are clear to auscultation. Cardiac exam reveals a regular rhythm. Abdomen is soft and nontender to palpation  Plan: Proceed with surveillance colonoscopy

## 2014-08-12 NOTE — Op Note (Signed)
Procedure: Surveillance colonoscopy. 12/26/2009 colonoscopy with removal of a 25 mm sessile serrated adenoma from the distal transverse colon.  Endoscopist: Earle Gell  Premedication: Propofol administered by anesthesia  Procedure: The patient was placed in the left lateral decubitus position. Anal inspection and digital rectal exam were normal. The Pentax pediatric colonoscope was introduced into the rectum and advanced to the cecum. A normal-appearing appendiceal orifice was identified. A normal-appearing ileocecal valve was identified. Colonic preparation for the exam today was graded as could after vigorous water lavage of retained solid matter. Advancement of the colonoscope around the colon was technically difficult due to transverse colon formation.  Rectum. Normal. Retroflexed view of the distal rectum normal  Sigmoid colon and descending colon. Normal  Splenic flexure. Normal  Transverse colon. Normal. The distal transverse colon polypectomy site was easily identified by the tattoo and showed no residual polypoid tissue  Hepatic flexure. A 7 mm sessile polyp was removed from the hepatic flexure with the hot snare and submitted for pathological interpretation  Ascending colon. Normal  Cecum and ileocecal valve. Normal   Assessment: A 7 mm sessile polyp was removed from the hepatic flexure with the hot snare and submitted for pathologic interpretation. Otherwise normal surveillance colonoscopy  Recommendation: Await hepatic flexure: Polyp pathology to determine when the patient should undergo a repeat surveillance colonoscopy

## 2014-08-12 NOTE — Anesthesia Postprocedure Evaluation (Signed)
  Anesthesia Post-op Note  Patient: Phillip Morris  Procedure(s) Performed: Procedure(s) (LRB): COLONOSCOPY WITH PROPOFOL (N/A)  Patient Location: PACU  Anesthesia Type: MAC  Level of Consciousness: awake and alert   Airway and Oxygen Therapy: Patient Spontanous Breathing  Post-op Pain: mild  Post-op Assessment: Post-op Vital signs reviewed, Patient's Cardiovascular Status Stable, Respiratory Function Stable, Patent Airway and No signs of Nausea or vomiting  Last Vitals:  Filed Vitals:   08/12/14 1435  BP: 103/51  Pulse: 65  Temp:   Resp: 9    Post-op Vital Signs: stable   Complications: No apparent anesthesia complications

## 2014-08-12 NOTE — Anesthesia Preprocedure Evaluation (Signed)
Anesthesia Evaluation  Patient identified by MRN, date of birth, ID band Patient awake    Reviewed: Allergy & Precautions, H&P , NPO status , Patient's Chart, lab work & pertinent test results  Airway Mallampati: II TM Distance: >3 FB Neck ROM: full    Dental no notable dental hx.    Pulmonary neg pulmonary ROS, former smoker,  breath sounds clear to auscultation  Pulmonary exam normal       Cardiovascular Exercise Tolerance: Good hypertension, Pt. on medications Rhythm:regular Rate:Normal     Neuro/Psych negative neurological ROS  negative psych ROS   GI/Hepatic negative GI ROS, Neg liver ROS,   Endo/Other  negative endocrine ROS  Renal/GU negative Renal ROS  negative genitourinary   Musculoskeletal   Abdominal   Peds  Hematology negative hematology ROS (+) ITP   Anesthesia Other Findings   Reproductive/Obstetrics negative OB ROS                           Anesthesia Physical Anesthesia Plan  ASA: II  Anesthesia Plan: MAC   Post-op Pain Management:    Induction:   Airway Management Planned:   Additional Equipment:   Intra-op Plan:   Post-operative Plan:   Informed Consent: I have reviewed the patients History and Physical, chart, labs and discussed the procedure including the risks, benefits and alternatives for the proposed anesthesia with the patient or authorized representative who has indicated his/her understanding and acceptance.   Dental Advisory Given  Plan Discussed with: CRNA and Surgeon  Anesthesia Plan Comments:         Anesthesia Quick Evaluation

## 2014-08-13 ENCOUNTER — Encounter (HOSPITAL_COMMUNITY): Payer: Self-pay | Admitting: Gastroenterology

## 2014-08-17 ENCOUNTER — Other Ambulatory Visit (HOSPITAL_BASED_OUTPATIENT_CLINIC_OR_DEPARTMENT_OTHER): Payer: BC Managed Care – PPO

## 2014-08-17 ENCOUNTER — Encounter: Payer: Self-pay | Admitting: Hematology and Oncology

## 2014-08-17 ENCOUNTER — Ambulatory Visit (HOSPITAL_BASED_OUTPATIENT_CLINIC_OR_DEPARTMENT_OTHER): Payer: BC Managed Care – PPO | Admitting: Hematology and Oncology

## 2014-08-17 VITALS — BP 124/82 | HR 49 | Temp 97.5°F | Resp 18 | Ht 72.0 in | Wt 183.2 lb

## 2014-08-17 DIAGNOSIS — D693 Immune thrombocytopenic purpura: Secondary | ICD-10-CM

## 2014-08-17 LAB — COMPREHENSIVE METABOLIC PANEL (CC13)
ALK PHOS: 76 U/L (ref 40–150)
ALT: 47 U/L (ref 0–55)
AST: 28 U/L (ref 5–34)
Albumin: 4.2 g/dL (ref 3.5–5.0)
Anion Gap: 10 mEq/L (ref 3–11)
BILIRUBIN TOTAL: 0.8 mg/dL (ref 0.20–1.20)
BUN: 15.3 mg/dL (ref 7.0–26.0)
CO2: 25 mEq/L (ref 22–29)
Calcium: 9.8 mg/dL (ref 8.4–10.4)
Chloride: 103 mEq/L (ref 98–109)
Creatinine: 1.3 mg/dL (ref 0.7–1.3)
GLUCOSE: 98 mg/dL (ref 70–140)
POTASSIUM: 3.9 meq/L (ref 3.5–5.1)
Sodium: 138 mEq/L (ref 136–145)
Total Protein: 7.1 g/dL (ref 6.4–8.3)

## 2014-08-17 NOTE — Progress Notes (Signed)
[facility] OFFICE PROGRESS NOTE  Kandice Hams, MD SUMMARY OF HEMATOLOGIC HISTORY: This is a pleasant gentleman who was diagnosed with ITP after routine blood work. He had bone marrow aspirate and biopsy which was unremarkable. When his platelet count dropped to about 30,000, he was started on prednisone but soon become refractory. He received 4 doses of rituximab between October to November 2011. INTERVAL HISTORY: Phillip Morris 58 y.o. male returns for further follow-up. He feels well. The patient denies any recent signs or symptoms of bleeding such as spontaneous epistaxis, hematuria or hematochezia.  I have reviewed the past medical history, past surgical history, social history and family history with the patient and they are unchanged from previous note.  ALLERGIES:  is allergic to aspirin and atorvastatin.  MEDICATIONS:  Current Outpatient Prescriptions  Medication Sig Dispense Refill  . acetaminophen (TYLENOL) 500 MG tablet Take 1,000 mg by mouth once as needed for headache.      . Ascorbic Acid (VITAMIN C PO) Take 1 tablet by mouth every morning.       . Cholecalciferol (VITAMIN D-3 PO) Take 1 tablet by mouth every morning.      . [facility]S BISACODYL 5 MG EC tablet       . CYANOCOBALAMIN PO Take 1 tablet by mouth every morning.       Marland Kitchen lisinopril (PRINIVIL,ZESTRIL) 10 MG tablet Take 10 mg by mouth every morning.       . loratadine (CLARITIN) 10 MG tablet Take 10 mg by mouth daily as needed for allergies.      Marland Kitchen MAGNESIUM OXIDE PO Take 1 tablet by mouth every morning.       . Multiple Vitamin (MULTIVITAMIN WITH MINERALS) TABS tablet Take 1 tablet by mouth every morning.       . polyethylene glycol-electrolytes (NULYTELY/GOLYTELY) 420 G solution       . rosuvastatin (CRESTOR) 5 MG tablet Take 5 mg by mouth every morning.        No current facility-administered medications for this visit.     REVIEW OF SYSTEMS:   Constitutional: Denies fevers, chills or night  sweats Eyes: Denies blurriness of vision Ears, nose, mouth, throat, and face: Denies mucositis or sore throat Respiratory: Denies cough, dyspnea or wheezes Cardiovascular: Denies palpitation, chest discomfort or lower extremity swelling Gastrointestinal:  Denies nausea, heartburn or change in bowel habits Skin: Denies abnormal skin rashes Lymphatics: Denies new lymphadenopathy or easy bruising Neurological:Denies numbness, tingling or new weaknesses Behavioral/Psych: Mood is stable, no new changes  All other systems were reviewed with the patient and are negative.  PHYSICAL EXAMINATION: ECOG PERFORMANCE STATUS: 0 - Asymptomatic  Filed Vitals:   08/17/14 1354  BP: 124/82  Pulse: 49  Temp: 97.5 F (36.4 C)  Resp: 18   Filed Weights   08/17/14 1354  Weight: 183 lb 3.2 oz (83.099 kg)    GENERAL:alert, no distress and comfortable SKIN: skin color, texture, turgor are normal, no rashes or significant lesions EYES: normal, Conjunctiva are pink and non-injected, sclera clear Musculoskeletal:no cyanosis of digits and no clubbing  NEURO: alert & oriented x 3 with fluent speech, no focal motor/sensory deficits  LABORATORY DATA:  I have reviewed the data as listed Results for orders placed in visit on 08/17/14 (from the past 48 hour(s))  COMPREHENSIVE METABOLIC PANEL (CW23)     Status: None   Collection Time    08/17/14  1:42 PM      Result Value Ref Range   Sodium 138  136 - 145 mEq/L   Potassium 3.9  3.5 - 5.1 mEq/L   Chloride 103  98 - 109 mEq/L   CO2 25  22 - 29 mEq/L   Glucose 98  70 - 140 mg/dl   BUN 15.3  7.0 - 26.0 mg/dL   Creatinine 1.3  0.7 - 1.3 mg/dL   Total Bilirubin 0.80  0.20 - 1.20 mg/dL   Alkaline Phosphatase 76  40 - 150 U/L   AST 28  5 - 34 U/L   ALT 47  0 - 55 U/L   Total Protein 7.1  6.4 - 8.3 g/dL   Albumin 4.2  3.5 - 5.0 g/dL   Calcium 9.8  8.4 - 10.4 mg/dL   Anion Gap 10  3 - 11 mEq/L    Lab Results  Component Value Date   WBC 6.0 01/08/2014    HGB 14.3 01/08/2014   HCT 40.2 01/08/2014   MCV 82.9 01/08/2014   PLT 81* 01/08/2014   ASSESSMENT & PLAN:  ITP (idiopathic thrombocytopenic purpura) This is chronic in nature. His last CBCs several months ago is unchanged compared to baseline. Due to lack of progression, I recommend he follows with PCP only and I would be happy to see him back in the future if his platelet count dropped to less than 60,000. He will only need platelet count rechecked once a year. The patient is comfortable with the plan of care.    All questions were answered. The patient knows to call the clinic with any problems, questions or concerns. No barriers to learning was detected.  I spent 15 minutes counseling the patient face to face. The total time spent in the appointment was 20 minutes and more than 50% was on counseling.     [facility], [place], MD 08/17/2014 4:22 PM

## 2014-08-17 NOTE — Assessment & Plan Note (Signed)
This is chronic in nature. His last CBCs several months ago is unchanged compared to baseline. Due to lack of progression, I recommend he follows with PCP only and I would be happy to see him back in the future if his platelet count dropped to less than 60,000. He will only need platelet count rechecked once a year. The patient is comfortable with the plan of care.

## 2015-09-08 ENCOUNTER — Telehealth: Payer: Self-pay | Admitting: Family Medicine

## 2015-09-08 ENCOUNTER — Encounter: Payer: Self-pay | Admitting: Neurology

## 2015-09-08 ENCOUNTER — Ambulatory Visit (INDEPENDENT_AMBULATORY_CARE_PROVIDER_SITE_OTHER): Payer: BLUE CROSS/BLUE SHIELD | Admitting: Neurology

## 2015-09-08 VITALS — BP 118/80 | HR 84 | Ht 73.0 in | Wt 173.0 lb

## 2015-09-08 DIAGNOSIS — Z820 Family history of epilepsy and other diseases of the nervous system: Secondary | ICD-10-CM

## 2015-09-08 DIAGNOSIS — Z818 Family history of other mental and behavioral disorders: Secondary | ICD-10-CM | POA: Insufficient documentation
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
## 2015-09-08 NOTE — Telephone Encounter (Signed)
Lmovm to rtn my call.   Appt scheduled with [facility] on 12/28/2015 @ 8:30 am with Dr. Gearlean Alf. [facility] [employer] 7th floor.    Ph: [phone] Fax: [phone]  Notes Faxed.

## 2015-09-08 NOTE — Patient Instructions (Signed)
1. Refer to [facility] for genetic counseling for strong family history of dementia 2. Physical exercise, brain stimulation exercises, and the Mediterranean diet, have been found to be helpful for brain health 3. Follow-up on as needed basis, call for any changes

## 2015-09-09 NOTE — Telephone Encounter (Signed)
Patient returned my call. Notified of appt information.

## 2016-01-06 ENCOUNTER — Telehealth: Payer: Self-pay | Admitting: Family Medicine

## 2016-01-06 NOTE — Telephone Encounter (Signed)
We referred patent to [facility] to be seen by Dr. Gearlean Alf on March 8th. Patient wanted to be referred for his family hx of dementia. We received correspondence from [facility] stating that they don't do pre-sympotmatic testing. They state after speaking with the patient and notifying him that they couldn't test him. Patient then stated that he was interested in getting a referral to [facility]. They were asking [place] to refer patient to [facility] they did attach a referral form for [facility] to fax with patient's clinicals. Once referral received by [facility] they will call the patient for an appt.   Referral form, ov note, ins information faxed to [facility]  Fax: [phone] Ph: [phone]

## 2016-03-16 IMAGING — US US RENAL
1 series · 14 of 25 positions shown · non-contrast
Comparison: Chest CT without contrast 05/01/2010.

CLINICAL DATA: 58-year-old male with stage III chronic kidney
disease. Initial encounter.

EXAM:
RENAL/URINARY TRACT ULTRASOUND COMPLETE

[Series 1: us renal · 0.24mm/px · 14 of 36 slices shown]
[im 1/36]
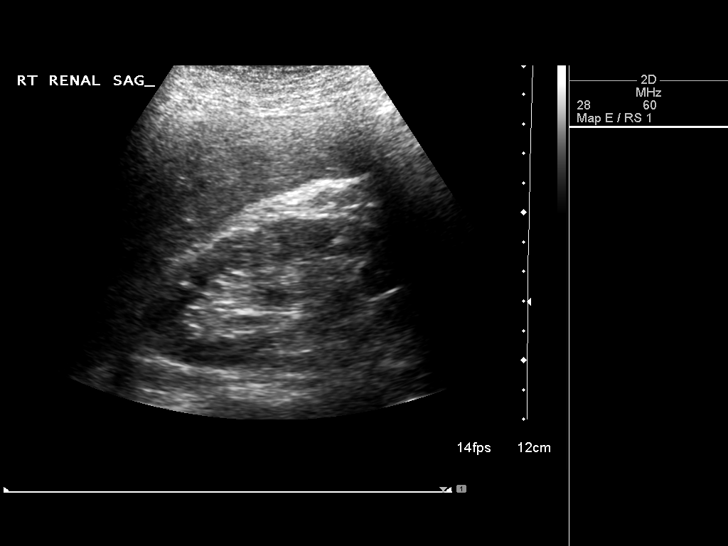
[im 3/36]
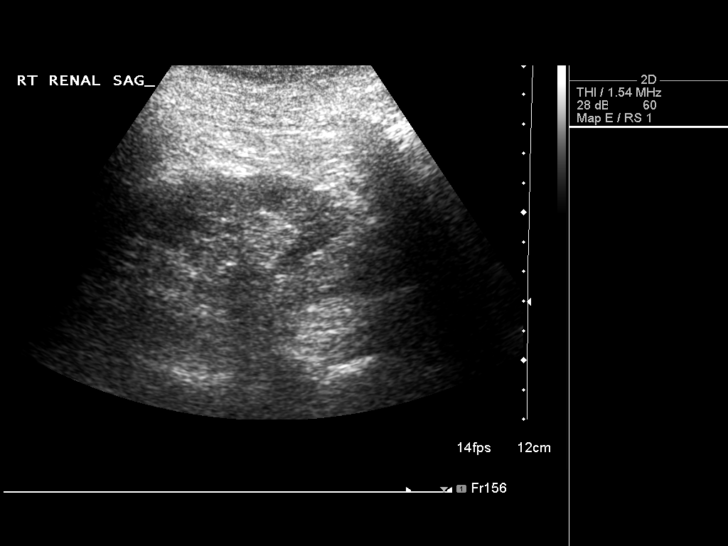
[im 6/36]
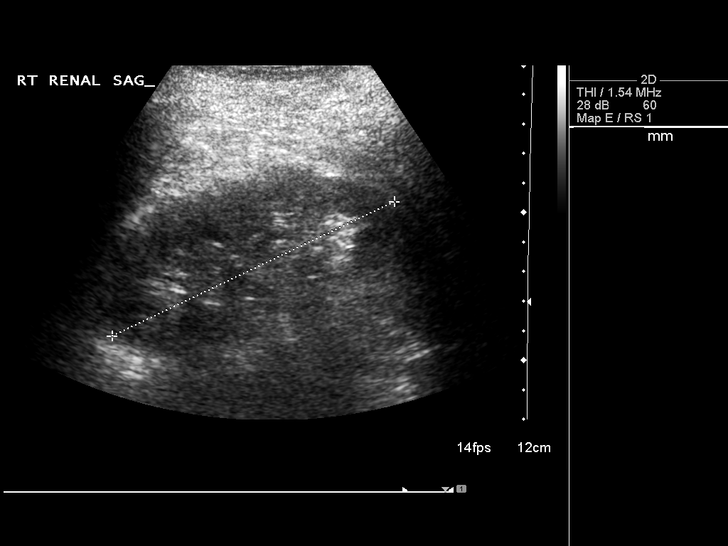
[im 9/36]
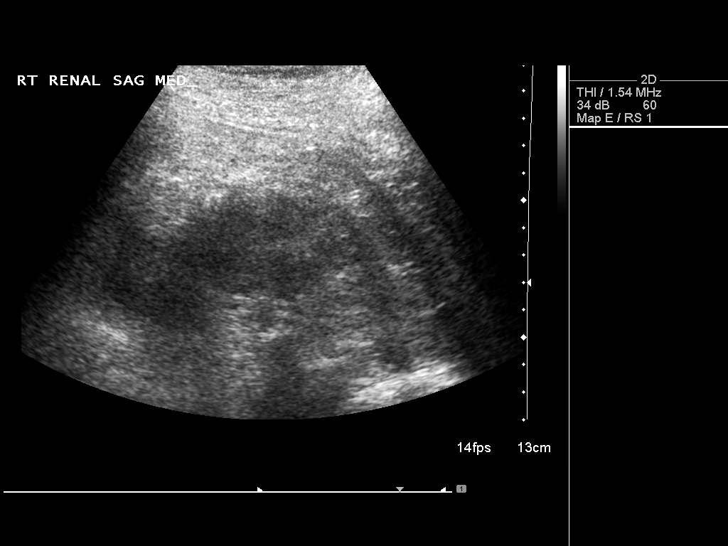
[im 12/36]
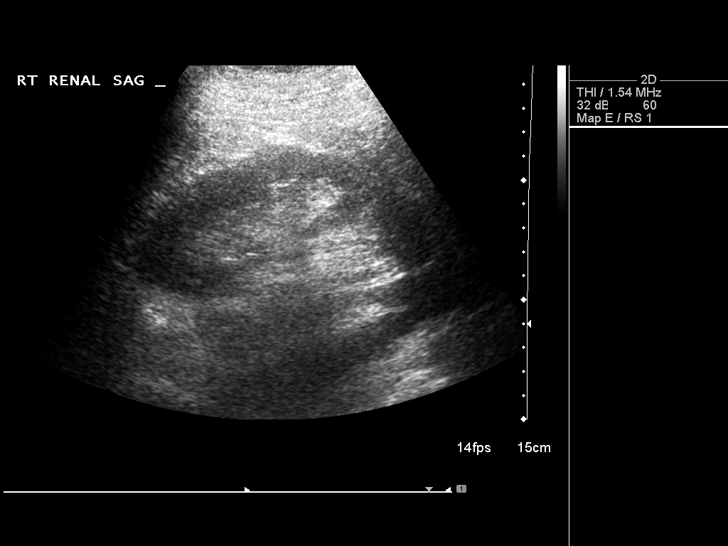
[im 14/36]
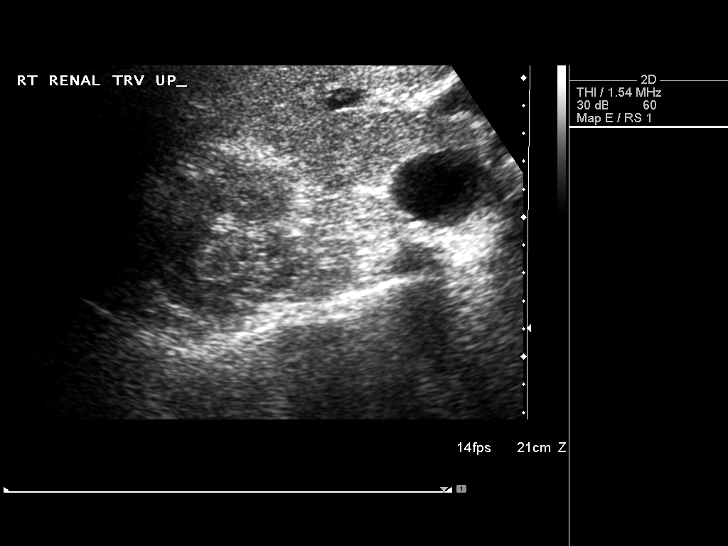
[im 17/36]
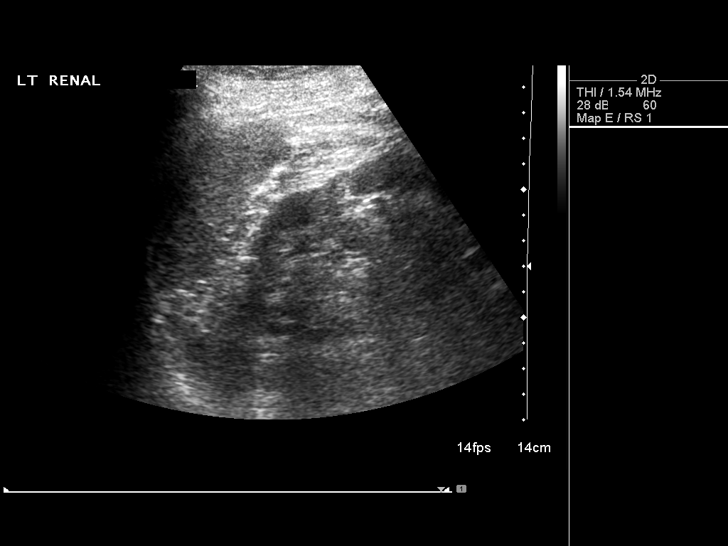
[im 19/36]
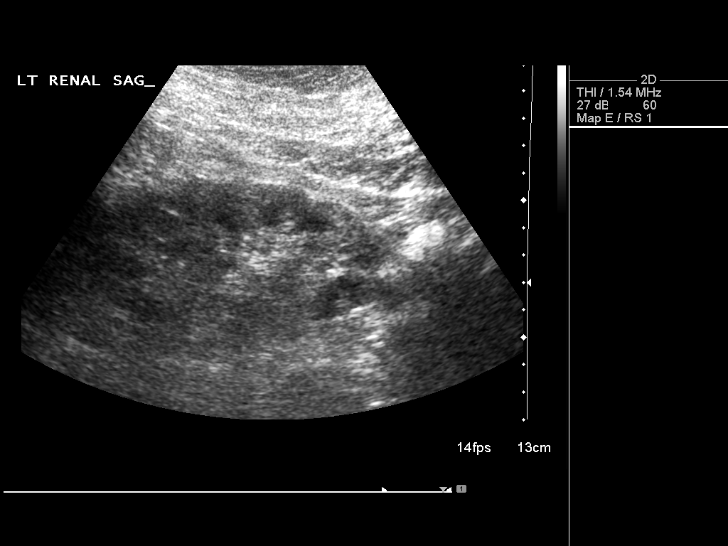
[im 22/36]
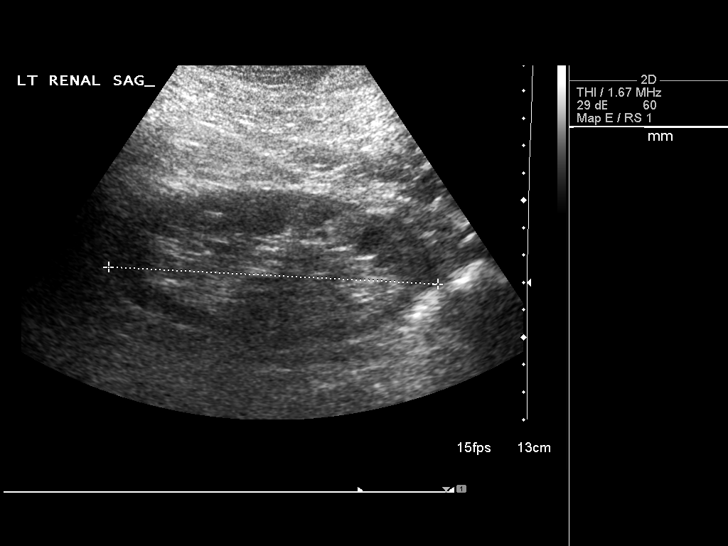
[im 24/36]
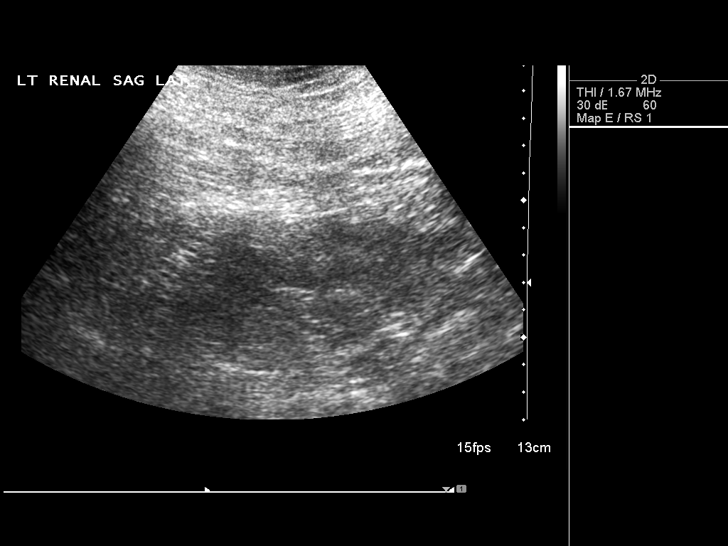
[im 27/36]
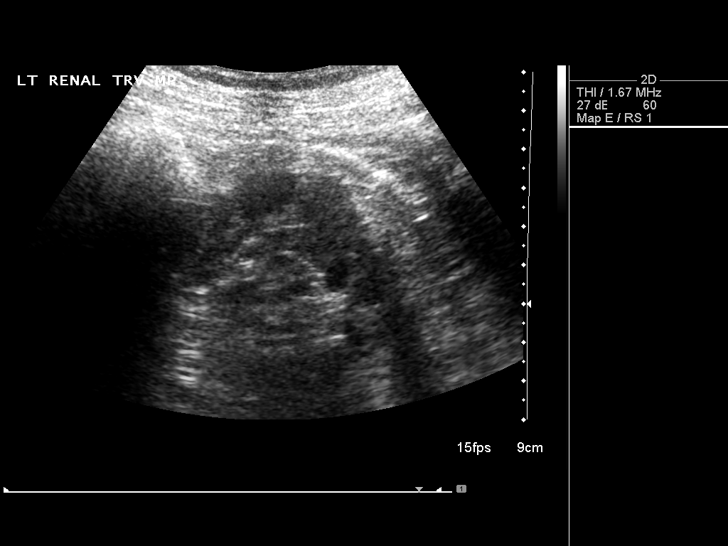
[im 30/36]
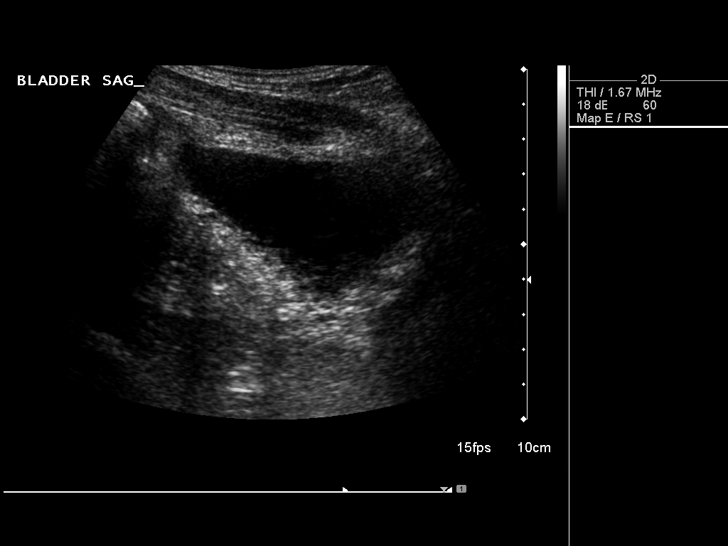
[im 33/36]
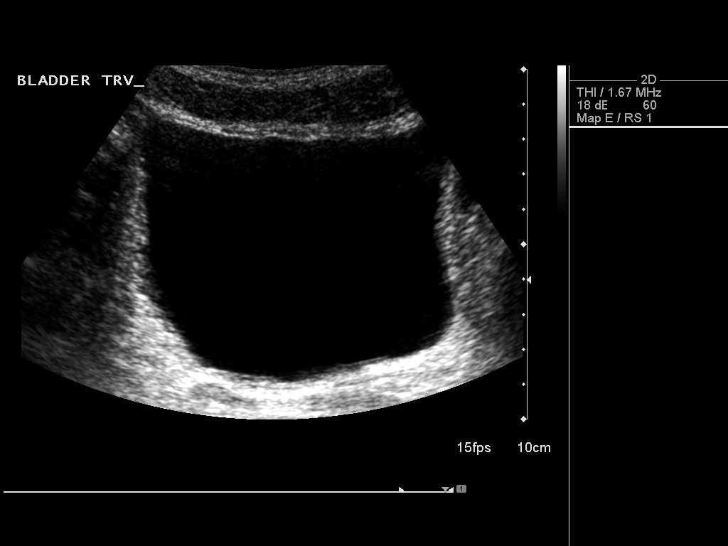
[im 36/36]
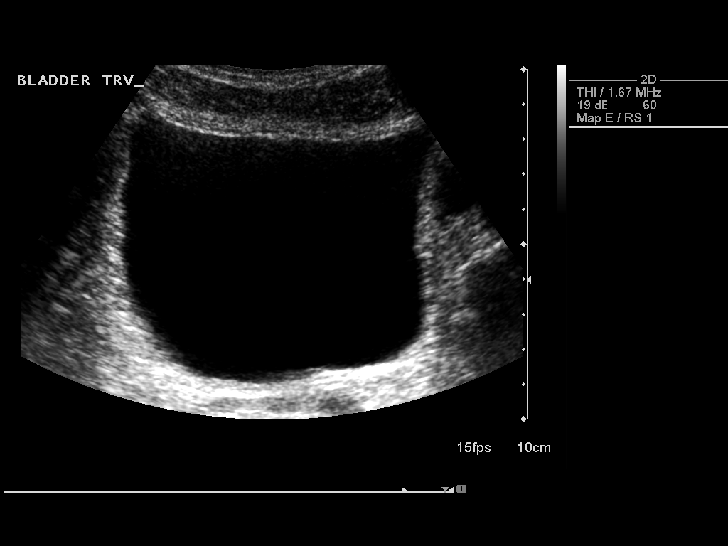

[14 of 25 positions shown; findings below may reference images not displayed]

FINDINGS: Right Kidney:

Length: 10.8 cm. Corticomedullary differentiation preserved.
Echogenicity within normal limits. No mass or hydronephrosis
visualized.

Left Kidney:

Length: 12.0 cm. Corticomedullary differentiation preserved.
Echogenicity within normal limits. No mass or hydronephrosis
visualized.

Bladder:

Appears normal for degree of bladder distention.
IMPRESSION: Normal sonographic appearance of the kidneys and bladder.

## 2016-04-26 ENCOUNTER — Inpatient Hospital Stay
Admission: EM | Admit: 2016-04-26 | Discharge: 2016-04-27 | DRG: 064 | Disposition: A | Discharge status: Other Acute Inpatient Hospital | Payer: BC Managed Care – PPO | Attending: Internal Medicine | Admitting: Internal Medicine

## 2016-04-26 ENCOUNTER — Emergency Department: Payer: BC Managed Care – PPO

## 2016-04-26 ENCOUNTER — Inpatient Hospital Stay: Payer: Self-pay

## 2016-04-26 DIAGNOSIS — R4182 Altered mental status, unspecified: Secondary | ICD-10-CM | POA: Diagnosis present

## 2016-04-26 DIAGNOSIS — Z87891 Personal history of nicotine dependence: Secondary | ICD-10-CM

## 2016-04-26 DIAGNOSIS — D693 Immune thrombocytopenic purpura: Secondary | ICD-10-CM | POA: Diagnosis present

## 2016-04-26 DIAGNOSIS — R412 Retrograde amnesia: Secondary | ICD-10-CM | POA: Diagnosis present

## 2016-04-26 DIAGNOSIS — N179 Acute kidney failure, unspecified: Secondary | ICD-10-CM | POA: Diagnosis present

## 2016-04-26 DIAGNOSIS — E0590 Thyrotoxicosis, unspecified without thyrotoxic crisis or storm: Secondary | ICD-10-CM | POA: Diagnosis present

## 2016-04-26 DIAGNOSIS — Z9114 Patient's other noncompliance with medication regimen: Secondary | ICD-10-CM

## 2016-04-26 DIAGNOSIS — X58XXXA Exposure to other specified factors, initial encounter: Secondary | ICD-10-CM

## 2016-04-26 DIAGNOSIS — S0990XA Unspecified injury of head, initial encounter: Secondary | ICD-10-CM

## 2016-04-26 DIAGNOSIS — I639 Cerebral infarction, unspecified: Secondary | ICD-10-CM | POA: Diagnosis present

## 2016-04-26 DIAGNOSIS — E069 Thyroiditis, unspecified: Secondary | ICD-10-CM | POA: Diagnosis present

## 2016-04-26 DIAGNOSIS — I6340 Cerebral infarction due to embolism of unspecified cerebral artery: Secondary | ICD-10-CM | POA: Diagnosis present

## 2016-04-26 DIAGNOSIS — I6203 Nontraumatic chronic subdural hemorrhage: Principal | ICD-10-CM | POA: Diagnosis present

## 2016-04-26 DIAGNOSIS — I10 Essential (primary) hypertension: Secondary | ICD-10-CM | POA: Diagnosis present

## 2016-04-26 DIAGNOSIS — E860 Dehydration: Secondary | ICD-10-CM | POA: Diagnosis present

## 2016-04-26 HISTORY — DX: Essential (primary) hypertension: I10

## 2016-04-26 HISTORY — DX: Immune thrombocytopenic purpura: D69.3

## 2016-04-26 LAB — ETHANOL: Alcohol: <10 mg/dL (ref 0–9)

## 2016-04-26 LAB — CBC AND DIFFERENTIAL
Basophils %: 0.4 % (ref 0.0–3.0)
Basophils Absolute: 0 10*3/uL (ref 0.0–0.3)
Eosinophils %: 2.2 % (ref 0.0–7.0)
Eosinophils Absolute: 0.2 10*3/uL (ref 0.0–0.8)
Hematocrit: 33.9 % — ABNORMAL LOW (ref 39.0–52.5)
Hemoglobin: 11.6 gm/dL — ABNORMAL LOW (ref 13.0–17.5)
Lymphocytes Absolute: 1 10*3/uL (ref 0.6–5.1)
Lymphocytes: 14.1 % — ABNORMAL LOW (ref 15.0–46.0)
MCH: 28 pg (ref 28–35)
MCHC: 34 gm/dL (ref 32–36)
MCV: 81 fL (ref 80–100)
MPV: 9.4 fL (ref 6.0–10.0)
Monocytes Absolute: 0.8 10*3/uL (ref 0.1–1.7)
Monocytes: 10.3 % (ref 3.0–15.0)
Neutrophils %: 73.1 % (ref 42.0–78.0)
Neutrophils Absolute: 5.4 10*3/uL (ref 1.7–8.6)
PLT CT: 82 10*3/uL — ABNORMAL LOW (ref 130–440)
RBC: 4.18 10*6/uL (ref 4.00–5.70)
RDW: 11.5 % (ref 11.0–14.0)
WBC: 7.3 10*3/uL (ref 4.0–11.0)

## 2016-04-26 LAB — BASIC METABOLIC PANEL
Anion Gap: 14.4 mMol/L (ref 7.0–18.0)
BUN / Creatinine Ratio: 20.5 Ratio (ref 10.0–30.0)
BUN: 32 mg/dL — ABNORMAL HIGH (ref 7–22)
CO2: 22.6 mMol/L (ref 20.0–30.0)
Calcium: 9.8 mg/dL (ref 8.5–10.5)
Chloride: 108 mMol/L (ref 98–110)
Creatinine: 1.56 mg/dL — ABNORMAL HIGH (ref 0.80–1.30)
EGFR: 48 mL/min/{1.73_m2} — ABNORMAL LOW (ref 60–150)
Glucose: 115 mg/dL — ABNORMAL HIGH (ref 70–99)
Osmolality Calc: 289 mOsm/kg (ref 275–300)
Potassium: 4 mMol/L (ref 3.5–5.3)
Sodium: 141 mMol/L (ref 136–147)

## 2016-04-26 LAB — TROPONIN I: Troponin I: 0.1 ng/mL — ABNORMAL HIGH (ref 0.00–0.02)
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
## 2016-04-26 NOTE — ED Notes (Signed)
Bed: C18-A  Expected date:   Expected time:   Means of arrival:   Comments:  Ems ams

## 2016-04-26 NOTE — ED Notes (Signed)
Spoke with sister of patient, contact number [phone]. States that patient spoke with his father on the phone last night and "sounded drunk" which is unusual for the patient, since he rarely drinks. Stated that the patient spoke with his father this morning and still sounded confused and the father received a phone message this afternoon from the patient stating "I've got to come over to you." Family had been attempting to get in touch with the patient, but unable to get in touch with him until they were contacted by [facility] office.   Sister states that patient has idiopathic thrombocytopenic purpura. Family state they are on their way from [place] and should arrive around 300am.

## 2016-04-27 ENCOUNTER — Emergency Department: Payer: BC Managed Care – PPO

## 2016-04-27 ENCOUNTER — Observation Stay: Payer: BC Managed Care – PPO

## 2016-04-27 ENCOUNTER — Inpatient Hospital Stay: Payer: BC Managed Care – PPO

## 2016-04-27 DIAGNOSIS — I6203 Nontraumatic chronic subdural hemorrhage: Principal | ICD-10-CM

## 2016-04-27 DIAGNOSIS — S0990XA Unspecified injury of head, initial encounter: Secondary | ICD-10-CM | POA: Diagnosis present

## 2016-04-27 DIAGNOSIS — R4182 Altered mental status, unspecified: Secondary | ICD-10-CM | POA: Diagnosis present

## 2016-04-27 DIAGNOSIS — I749 Embolism and thrombosis of unspecified artery: Secondary | ICD-10-CM

## 2016-04-27 DIAGNOSIS — I639 Cerebral infarction, unspecified: Secondary | ICD-10-CM | POA: Diagnosis present

## 2016-04-27 LAB — LIPID PANEL
Cholesterol: 122 mg/dL (ref 75–199)
Coronary Heart Disease Risk: 5.81
HDL: 21 mg/dL — ABNORMAL LOW (ref 40–55)
LDL Calculated: 81 mg/dL
Triglycerides: 101 mg/dL (ref 10–150)
VLDL: 20 (ref 0–40)

## 2016-04-27 LAB — ECG 12-LEAD
P Wave Axis: 79 deg
P-R Interval: 146 ms
Patient Age: 59 years
Q-T Interval(Corrected): 424 ms
Q-T Interval: 372 ms
QRS Axis: 61 deg
QRS Duration: 95 ms
T Axis: 72 years
Ventricular Rate: 78 //min

## 2016-04-27 LAB — VH URINALYSIS WITH MICROSCOPIC AND CULTURE IF INDICATED
Bilirubin, UA: NEGATIVE
Blood, UA: NEGATIVE
Glucose, UA: NEGATIVE mg/dL
Ketones UA: 20 mg/dL — AB
Leukocyte Esterase, UA: NEGATIVE Leu/uL
Nitrite, UA: NEGATIVE
Protein, UR: 100 mg/dL — AB
RBC, UA: 5 /hpf — ABNORMAL HIGH (ref 0–4)
Squam Epithel, UA: <1 /hpf (ref 0–2)
Urine Specific Gravity: 1.021 (ref 1.001–1.040)
Urobilinogen, UA: 2 mg/dL — AB
WBC, UA: 2 /hpf (ref 0–4)
pH, Urine: 5 pH (ref 5.0–8.0)

## 2016-04-27 LAB — VH URINE DRUG SCREEN
Amphetamine: NEGATIVE
Barbiturates: NEGATIVE
Buprenorphine, Urine: NEGATIVE
Cannabinoids: NEGATIVE
Cocaine: NEGATIVE
Opiates: NEGATIVE
Phencyclidine: NEGATIVE
Urine Benzodiazepines: NEGATIVE
Urine Creatinine Random: 176.37 mg/dL
Urine Methadone Screen: NEGATIVE
Urine Oxycodone: NEGATIVE
Urine Specific Gravity: 1.029 (ref 1.001–1.040)
pH, Urine: 5 pH (ref 5.0–8.0)

## 2016-04-27 LAB — AMMONIA: Ammonia: 40.1 ug/dL (ref 31.0–123.0)

## 2016-04-27 LAB — VITAMIN B12 AND FOLATE
Folate: 16.9 ng/mL (ref 7.0–19.9)
Vitamin B-12: >2000 pg/mL — ABNORMAL HIGH (ref 213–816)

## 2016-04-27 LAB — TSH
TSH: <0.01 u[IU]/mL — ABNORMAL LOW (ref 0.40–4.20)
TSH: <0.01 u[IU]/mL — ABNORMAL LOW (ref 0.40–4.20)

## 2016-04-27 LAB — TROPONIN I
Troponin I: 0.22 ng/mL — ABNORMAL HIGH (ref 0.00–0.02)
Troponin I: 0.12 ng/mL — ABNORMAL HIGH (ref 0.00–0.02)

## 2016-04-27 LAB — T4, FREE: T4 Free: 3.02 ng/dL — ABNORMAL HIGH (ref 0.70–1.48)

## 2016-04-27 LAB — VH DEXTROSE STICK GLUCOSE: Glucose POCT: 118 mg/dL — ABNORMAL HIGH (ref 70–99)

## 2016-04-27 LAB — T3, FREE: T3, Free: 9.1 pg/mL — ABNORMAL HIGH (ref 1.71–3.71)

## 2016-04-27 MED ORDER — SODIUM CHLORIDE BACTERIOSTATIC 0.9 % IJ SOLN
INTRAMUSCULAR | Status: AC
Start: 2016-04-27 — End: ?
  Filled 2016-04-27: qty 10

## 2016-04-27 MED ORDER — ATORVASTATIN CALCIUM 80 MG PO TABS
80.0000 mg | ORAL_TABLET | Freq: Every evening | ORAL | Status: AC
Start: 2016-04-27 — End: ?

## 2016-04-27 MED ORDER — ATORVASTATIN CALCIUM 40 MG PO TABS
80.0000 mg | ORAL_TABLET | Freq: Every evening | ORAL | Status: DC
Start: 2016-04-27 — End: 2016-04-27
  Administered 2016-04-27: 80 mg via ORAL
  Filled 2016-04-27 (×2): qty 2

## 2016-04-27 MED ORDER — ACETAMINOPHEN 160 MG/5ML PO SOLN
650.0000 mg | ORAL | Status: DC | PRN
Start: 2016-04-27 — End: 2016-04-27

## 2016-04-27 MED ORDER — METHIMAZOLE 5 MG PO TABS
5.0000 mg | ORAL_TABLET | Freq: Three times a day (TID) | ORAL | Status: AC
Start: 2016-04-27 — End: ?

## 2016-04-27 MED ORDER — LEVETIRACETAM 750 MG PO TABS
750.0000 mg | ORAL_TABLET | Freq: Two times a day (BID) | ORAL | Status: DC
Start: 2016-04-27 — End: 2016-04-27
  Administered 2016-04-27: 750 mg via ORAL
  Filled 2016-04-27 (×2): qty 1

## 2016-04-27 MED ORDER — ACETAMINOPHEN 325 MG PO TABS
650.0000 mg | ORAL_TABLET | ORAL | Status: AC | PRN
Start: 2016-04-27 — End: ?

## 2016-04-27 MED ORDER — SODIUM CHLORIDE 0.9 % IV BOLUS
1000.0000 mL | Freq: Once | INTRAVENOUS | Status: AC
Start: 2016-04-27 — End: 2016-04-27
  Administered 2016-04-27: 1000 mL via INTRAVENOUS

## 2016-04-27 MED ORDER — GADOBUTROL 1 MMOL/ML IV SOLN
7.0000 mL | Freq: Once | INTRAVENOUS | Status: AC | PRN
Start: 2016-04-27 — End: 2016-04-27
  Administered 2016-04-27: 7 mmol via INTRAVENOUS

## 2016-04-27 MED ORDER — GADOBUTROL 1 MMOL/ML IV SOLN
10.0000 mL | Freq: Once | INTRAVENOUS | Status: DC | PRN
Start: 2016-04-27 — End: 2016-04-27

## 2016-04-27 MED ORDER — LEVETIRACETAM 750 MG PO TABS
750.0000 mg | ORAL_TABLET | Freq: Two times a day (BID) | ORAL | Status: AC
Start: 2016-04-27 — End: ?

## 2016-04-27 MED ORDER — METHIMAZOLE 5 MG PO TABS
5.0000 mg | ORAL_TABLET | Freq: Three times a day (TID) | ORAL | Status: DC
Start: 2016-04-27 — End: 2016-04-27
  Administered 2016-04-27 (×2): 5 mg via ORAL
  Filled 2016-04-27 (×4): qty 1

## 2016-04-27 MED ORDER — ACETAMINOPHEN 325 MG PO TABS
650.0000 mg | ORAL_TABLET | ORAL | Status: DC | PRN
Start: 2016-04-27 — End: 2016-04-27

## 2016-04-27 MED ORDER — ACETAMINOPHEN 650 MG RE SUPP
650.0000 mg | RECTAL | Status: DC | PRN
Start: 2016-04-27 — End: 2016-04-27

## 2016-04-27 MED ORDER — CLONIDINE HCL 0.1 MG PO TABS
0.1000 mg | ORAL_TABLET | ORAL | Status: DC | PRN
Start: 2016-04-27 — End: 2016-04-27

## 2016-04-27 MED ORDER — SODIUM CHLORIDE 0.9 % IV SOLN
Freq: Every day | INTRAVENOUS | Status: DC
Start: 2016-04-27 — End: 2016-04-27
  Filled 2016-04-27 (×2): qty 1000

## 2016-04-27 NOTE — ED Notes (Signed)
Patient transported to MRI 

## 2016-04-27 NOTE — ED Notes (Signed)
[facility] ambulance service called. York Spaniel they should be here between 5-6.   If there is a problem call [phone] and ask for Darel Hong.

## 2016-04-27 NOTE — ED Notes (Signed)
When transport arrives, please call Trey Paula at transfer center to let them know. [phone]

## 2016-04-27 NOTE — ED Notes (Signed)
Seizure bed rail pads ordered

## 2016-04-27 NOTE — ED Notes (Signed)
Dr.Rahman at the bedside to admit patient.

## 2016-04-27 NOTE — ED Notes (Signed)
[employer] leaving from [place]  in the next half hour. ETA between 17:00-19:00.

## 2016-04-27 NOTE — ED Notes (Signed)
Patient found picking off cardiac stickers, removing pulse ox. Patient redirected and reminded that the monitor needed to stay on. Patient states understanding and is back resting in bed.
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
## 2016-04-27 NOTE — ED Notes (Signed)
Family at bedside. 

## 2016-04-27 NOTE — ED Notes (Addendum)
Report called to ED charge RN Selena Batten) at [facility]   Will call and update when transport arrives.

## 2016-04-27 NOTE — ED Notes (Signed)
MRI called, coming to get him for neck MRI which [facility] approved.

## 2016-04-27 NOTE — ED Notes (Signed)
Per Dr. Moises Blood NP, patient seen over in CV lab.

## 2016-04-27 NOTE — Progress Notes (Addendum)
Patient being transferred to OSH per family request.  No new findings on physical exam.      Terrill Mohr MD  ACCESS (Acute Care and Emergency Surgery [facility])  [facility]  Phone # [phone]  Pager # [phone]

## 2016-04-27 NOTE — ED Notes (Signed)
BAck from MRI. Dr. Lanny Hurst at bedside. Transfer paperwork done.

## 2016-04-27 NOTE — Discharge Summary (Signed)
[facility]      Patient: Bradley Sims.  Admission Date: 04/26/2016   DOB: 09/26/1956  Discharge Date: 04/27/2016    MRN: [id number]  Discharge Attending:Clabe Seal     Referring Physician: Christa See, MD  PCP: Bradley See, MD       DISCHARGE SUMMARY     Discharge Information   Admission Diagnosis:   <principal problem not specified>    Discharge Diagnosis:   Active Hospital Problems    Diagnosis   . Altered mental status   . Chronic subdural hematoma   . Acute cerebral infarction   . Head trauma, initial encounter   Hyperthyroidism (new) probly from thyroiditis  Concern for sz  Concussion   ITP      Discharge Condition: Medically Stable  Consultants: Dr Cleatis Polka (neurosurgery), Dr Alvino Chapel (neurology) And Dr Montez Morita (trauma)  Discharged to: transferred to [facility]    Discharge Medications:     Medication List      START taking these medications          acetaminophen 325 MG tablet   Commonly known as:  TYLENOL   Take 2 tablets (650 mg total) by mouth every 4 (four) hours as needed for Pain.       atorvastatin 80 MG tablet   Commonly known as:  LIPITOR   Take 1 tablet (80 mg total) by mouth nightly.       levETIRAcetam 750 MG tablet   Commonly known as:  KEPPRA   Take 1 tablet (750 mg total) by mouth every 12 (twelve) hours.       methIMAzole 5 MG tablet   Commonly known as:  TAPAZOLE   Take 1 tablet (5 mg total) by mouth every 8 (eight) hours.         CONTINUE taking these medications          B-12 PO       ONE-A-DAY MENS PO       VITAMIN D-3 PO         STOP taking these medications          Omega-3 Fish Oil 500 MG Caps            Where to Get Your Medications      Information about where to get these medications is not yet available     ! Ask your nurse or doctor about these medications    - acetaminophen 325 MG tablet  - atorvastatin 80 MG tablet  - levETIRAcetam 750 MG tablet  - methIMAzole 5 MG tablet              Hospital Course   Presentation History   Bradley Sims is a 60 y.o.  male past medical history of ITP, hypertension non compliant with medication drop on to ED by police for altered mental status. Patient was driving erratically police stopped him as per report patient was initially confused. In ED patient became awake and alert oriented. However at times he got confusion as per ED physician. In ED found to have lacerated injury on right forehead also raccoon eyes. CT head was unremarkable. Only showed chronic subdural hygroma. Patient lives in [place]. He mentioned that he was driving to his father's house to [place]. Intermittently he got confused. He mentioned that he had injury on right side of head and he noticed ecchymosis around eyes 3 days ago unsure about event. Patient didn't have any wallet. In ED patient was hemodynamically stable.  Troponin was 0.1 EKG was unremarkable BUN/serum creatinine 32/1.56    Hospital Course (0 Days)     This patient admitted by my partner early this morning for acute mental status changes.   Above is his admission note. Pt is feeling fine now. Pt and sister requesting to be transferred to [facility] I spoke to The hospital and kindly they accepted patient . Neurology neurosurgery and trauma surgery saw the patient while here. Neurosurgery does not believe patient in need for any surgical intervention at this time. I spoke to him on the phone she needs an open door for anticoagulation if absolutely needed. I will defer this to doctors at [facility] as I am not comfortable to start him on any prior to transfer considering SDH and his thrombocytopenia. No frank clinical evidence of infection from today.    Procedures/Imaging:   Ct Abdomen Wo Iv/ Wo Po Cont    04/27/2016  No acute abdominopelvic process. ReadingStation:[email or web address]    Ct Head Wo- Specify Condition/reader    04/27/2016  No acute intracranial abnormality. ReadingStation:[email or web address]    Ct Cervical Spine Wo Contrast    04/27/2016  No acute cervical injury.  ReadingStation:[email or web address]    Mri Brain W Wo Contrast    04/27/2016  Abnormal MRI scan of the head with and without contrast demonstrating a single punctate acute infarction in the right parietal area from the result of emboli. There appears to be the presence of an extensive right chronic subdural hematoma unchanged from  the CT head study. Slight mass effect without midline shift. Evidence of shear injury as well as a right inferior frontal contusion from trauma. 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 Evidence of mild  diffuse leptomeningeal enhancement that can be the result of trauma, infection, inflammation, or intracranial hypotension. Clinical correlation is recommended. Results discussed with the referring physician shortly after the completion of the study. ReadingStation:[id number]    [place] Head Neck Soft Tissue    04/27/2016  Nonspecific thyroiditis. ReadingStation:[email or web address]    Ct Maxillofacial Bones    04/27/2016  No acute osseous process. ReadingStation:[email or web address]    Xr Chest Ap Portable    04/27/2016  No acute cardiopulmonary process. ReadingStation:[email or web address]    2d echo:  CONCLUSIONS   The left ventricular ejection fraction is normal, estimated at 55-60%. The left ventricular cavity size, wall thickness, systolic   function are normal with no regional wall motion abnormalities present. There is grade II diastolic dysfunction of the left ventricle   (pseudonormal filling pattern).   Left atrial volume is severely increased.   The mitral valve regurgitation is moderate to severe.   Estimated RVSP is elevated at .   No patent foramen ovale is demonstrated by agitated saline injection.   There are no valvular vegetations or intercavitary thrombi present to serve as a thrombotic source.        Brandon Melnick MD     MRA neck done pending report       Best Practices   Was the patient admitted with either a CHF Exacerbation or Pneumonia? no      Progress Note/Physical Exam at Discharge     Subjective: pt feeling fine    Filed Vitals:    04/27/16 1106 04/27/16 1131 04/27/16 1201 04/27/16 1231   BP: 164/67 161/73 158/77 157/71   Pulse: 78 76 77 77   Temp:       TempSrc:       Resp: 14 22 20  25   Height:       Weight:       SpO2: 98% 97% 98% 98%           Upon my exam today physical exam reveals no new findings.       Diagnostics       Last Labs     Recent Labs  Lab 04/26/16  2241   WBC 7.3   RBC 4.18   HEMOGLOBIN 11.6*   HEMATOCRIT 33.9*   MCV 81   PLT CT 82*         Recent Labs  Lab 04/26/16  2241   SODIUM 141   POTASSIUM 4.0   CHLORIDE 108   CO2 22.6   BUN 32*   CREATININE 1.56*   GLUCOSE 115*   CALCIUM 9.8             Microbiology Results     None           Patient Instructions   Discharge Diet: Addressed  Discharge Activity: Addressed    Follow Up Appointment:  Being transferred to [facility]     Time spent examining patient, discussing with patient/family regarding hospital course, chart review, reconciling medications and discharge planning: 40 minutes.    Signed,  Kristinia Leavy    1:31 PM 04/27/2016

## 2016-04-27 NOTE — H&P (Addendum)
History of Present Illness  04/27/2016   Chief complaint : Altered mental status    HPI:    Bradley Sims is a 60 y.o.  male past medical history of ITP, hypertension non compliant with medication drop on to ED by police  for altered mental status. Patient was driving erratically police stopped him as per report patient was initially confused. In ED patient became awake and alert oriented. However at times he got confusion as per ED physician. In ED found to have lacerated injury on right forehead also raccoon eyes. CT head was unremarkable. Only showed chronic subdural hygroma. Patient lives in [place]. He mentioned that he was driving to his father's house to [place]. Intermittently he got confused. He mentioned that he had injury on right side of head and he noticed ecchymosis around eyes 3 days ago unsure about event. Patient didn't have any wallet. In ED patient was hemodynamically stable.  Troponin was 0.1 EKG was unremarkable BUN/serum creatinine 32/1.56        Past Medical History   Past Medical History   Diagnosis Date   . Hypertension    . Idiopathic thrombocytopenia purpura            Past Surgical History  History reviewed. No pertinent past surgical history.    Allergies  No Known Allergies    Prior to Admission Medications  Current Facility-Administered Medications   Medication Dose Route Frequency Provider Last Rate Last Dose   . sodium chloride 0.9 % 1,000 mL with thiamine 100 mg, folic acid 1 mg infusion   Intravenous Daily Minus Liberty, Blimi Godby A, MD       . sodium chloride 0.9 % bolus 1,000 mL  1,000 mL Intravenous Once in ED Donalynn Furlong, MD 1,000 mL/hr at 04/27/16 0148 1,000 mL at 04/27/16 0148     No current outpatient prescriptions on file.         Social History  Social History   Substance Use Topics   . Smoking status: Former Games developer   . Smokeless tobacco: Not on file   . Alcohol Use: Yes      Comment: "socially"       Family History  Family history was obtained and was non  contributory for cardiac history, cancer history.     Review of Systems  Ten completed review of systems including eyes, ENT, cardiovascular, respiratory, gastrointestinal, genitourinary, psychiatric, neurologic, integumentary, allergic/hematology,  are reviewed and found unremarkable except pertinent positives mentioned in the history of present illness and past medical history.       Physical Exam  BP 172/86 mmHg  Pulse 83  Temp(Src) 99 F (37.2 C) (Oral)  Resp 20  Ht 1.854 m (6\' 1" )  Wt 72.576 kg (160 lb)  BMI 21.11 kg/m2  SpO2 99%  No intake or output data in the 24 hours ending 04/27/16 0247  Physical Exam    1) General appearance:  well developed,well nourished, in no apparent acute cardiorespiratory distress.     2) HEENT: Head is atraumatic and normocephalic. Pupils are equally reactive to light and accommodation. Extraocular muscles are intact. Patient has intact external auditory canal. No abnormal lesions or bleeding from nose. Oral mucosa moist with no pharyngeal congestion. Lacerated injury on right scalp appeared at least 45 days old. Bilateral raccoon eyes.     3) Neck: Supple. Trachea is central, no JVD or carotid bruit.     4) Chest: Clear to auscultation bilaterally, no wheezing     6)  CVS: The S1, S2 normal. Regular rate and rhythm.No murmur    7) Abdomen:  soft, non tender, non distended, no palpable mass. Bowel sounds audible bowel.     8) Musculoskeletal: Patient has 5/5 motor strength in bilateral upper extremities as well as bilateral LE. No pitting edema in bilateral lower extremities.    9) Neurological: Cranial nerves II-XII intact. Grossly non focal    10) Psychiatric: Alert and oriented x 3. Mood is appropriate.     11) Integumentary: warm with normal skin turgor, no rash    12) Lymphatics: No lymphadenopathy in axillary, cervial and inguinal area.     EKG reviewed by me sinus rhythm at 78 bpm PVCs  Labs    Labs Reviewed   TROPONIN I - Abnormal; Notable for the following:      Troponin I 0.10 (*)     All other components within normal limits   BASIC METABOLIC PANEL - Abnormal; Notable for the following:     Glucose 115 (*)     Creatinine 1.56 (*)     BUN 32 (*)     EGFR 48 (*)     All other components within normal limits   CBC AND DIFFERENTIAL - Abnormal; Notable for the following:     Hemoglobin 11.6 (*)     Hematocrit 33.9 (*)     PLT CT 82 (*)     Lymphocytes 14.1 (*)     All other components within normal limits   VH URINALYSIS WITH MICROSCOPIC AND CULTURE IF INDICATED       - Abnormal; Notable for the following:     Protein, UR 100 (*)     Ketones UA 20 (*)     Urobilinogen, UA 2.0 (*)     RBC, UA 5 (*)     Bacteria, UA Rare (*)     All other components within normal limits   TSH - Abnormal; Notable for the following:     Thyroid Stimulating Hormone <0.01 (*)     All other components within normal limits   ETHANOL   VH URINE DRUG SCREEN   AMMONIA   TROPONIN I   VITAMIN B12 AND FOLATE   TSH       Assessment and Plan    1. Altered mental status with intermittent confusion  CT head was unremarkable  UA negative for infection  So far etiology unknown  Postconcussion syndrome in the history of lacerated injury on head ?  CT face and CT cervical spine ordered  MRI brain ordered to rule out intracranial pathology  TSH vitamin B12 folic acid ordered  Neuro check 4 hourly EEG ordered to rule out seizure  2. Chronic thrombocytopenia as patient has history of ITP  We will monitor for now   3. Elevated troponin without any chest pain  Likely from AKI  Admitting to telemetry with serial troponin  4. AKI will continue IV fluid          CODE Status : Full code    GI prophylaxis not required  DVT prophylaxis none  Addendum: MRI result was notified by neurologist patient has small acute infarct also has chronic subdural hematoma with mild mass effect without midline shift evidence of she are injury as well as right inferior frontal contusion from trauma.  Neurology already evaluated the patient for  acute embolic stroke and possible seizure started on seizure medication unable to anticoagulate as patient has subdural hematoma  I spoke to Dr. Radene Knee  from neurosurgeon for subdural hematoma who agreed with above management plan will see the patient in a.m.  I spoke to Dr. Montez Morita from access surgery as patient has possible trauma related injury he will see the patient as soon as possible  I reviewed TSH was low with high free T4 and T3 so patient has primary hyperparathyroidism I orderered neck ultrasound  I started on methimazole oral unable to start on atenolol as patient has acute stroke discuss with neurology when we can start on atenolol  Will [place] heparin with keep nothing by mouth for now speech and swallow evaluation ordered  Admission status changed to inpatient       Ilda Mori  04/27/2016    2:47 AM

## 2016-04-27 NOTE — Consults (Signed)
[facility] Patient              Neurosurgical Consultation  [facility]  Catarina Hartshorn MD  [name] Catlett NP-C    Date Time: 04/27/2016 10:54 AM  Patient Name: Bradley Sims, Bradley Sims.  Requesting Physician: Clabe Seal, MD  Reason for Consultation: SDH    For neurosurgery questions:CORTEXT or  page Tami Catlett NP-C pager # [phone] or Dr. Glade Stanford pager # [phone].      History:   Bradley Sims. is a right handed 60 y.o. male who presents to the hospital on 04/26/2016 with   does not have any pertinent problems on file..  Confusion & SDH. Patient was pulled over by [place] for erratic driving then brought to ED for confusion. He is amnesic for events & continues with some disorientation. He is unsure how he came to be in [place] or how he got his raccoon eyes. He reports he was dieting but is now trying to gain weight. Neurosurgery was consulted for his chronic SDH.     Past Medical History:     Past Medical History   Diagnosis Date   . Hypertension    . Idiopathic thrombocytopenia purpura        Past Surgical History:   History reviewed. No pertinent past surgical history.    Family History:   No family history on file.    Social History:     Social History     Social History   . Marital Status: Single     Spouse Name: N/A   . Number of Children: N/A   . Years of Education: N/A     Social History Main Topics   . Smoking status: Former Games developer   . Smokeless tobacco: Not on file   . Alcohol Use: Yes      Comment: "socially"   . Drug Use: Not on file   . Sexual Activity: Not on file     Other Topics Concern   . Not on file     Social History Narrative   . No narrative on file       Allergies:   No Known Allergies    Medications:     Current Facility-Administered Medications   Medication Dose Route Frequency   . atorvastatin  80 mg Oral QHS   . levETIRAcetam  750 mg Oral Q12H SCH   . methIMAzole  5 mg Oral Q8H SCH   . IV fluids with thiamine, folic acid   Intravenous Daily       Review of  Systems:   A 10 point Review of Systems was negative with the exception of the pertinent positives found in the History.      Physical Exam:     Filed Vitals:    04/27/16 0802   BP: 158/78   Pulse: 78   Temp:    Resp: 16   SpO2: 97%         General Appearance:    Alert, cooperative, oriented to person & time only. Knows he is in hospital. Says he knows where [place] is but cannot describe where [place] is on a map. He answers yes to some questions but when asked for details he does not really know   Head:    Normocephalic, traumatic right head   Eyes:    Sclera anicteric, PERRL, EOMs intact both eyes. Periorbital ecchymosis     Spine:     Cervical ROM full and  painfree, no tenderness in the C/T/L spine.   Lungs:     Clear to auscultation bilaterally   Heart:    Regular rate and rhythm   Abdomen:     Positive bowel sounds, soft   Extremities:   Extremities normal, atraumatic, no cyanosis or edema   Neurologic:   CNII-XII intact. Strength is 5/5 in all four extremities.  DTR's equal and symmetric.  Negative babinski and hoffman's.  Sensation is intact to light touch throughout.         Labs:   Relevant labs reviewed.  Results     Procedure Component Value Units Date/Time    Vitamin B12 and Folate [[id number]]  (Abnormal) Collected:  04/27/16 0648    Specimen Information:  Plasma Updated:  04/27/16 0918     Folate 16.9 ng/mL      Vitamin B-12 >2000 (H) pg/mL     Lipid Panel [[id number]]  (Abnormal) Collected:  04/27/16 0648    Specimen Information:  Plasma Updated:  04/27/16 0751     Cholesterol 122 mg/dL      Triglycerides 782 mg/dL      HDL 21 (L) mg/dL      LDL Calculated 81 mg/dL      Coronary Heart Disease Risk 5.81      VLDL 20     Troponin I [[id number]]  (Abnormal) Collected:  04/27/16 0648    Specimen Information:  Plasma Updated:  04/27/16 0728     Troponin I 0.12 (H) ng/mL     T3, Free [[id number]]  (Abnormal) Collected:  04/27/16 0336    Specimen Information:  Plasma Updated:  04/27/16 0512     T3, Free  9.10 (H) pg/mL     TSH [[id number]]  (Abnormal) Collected:  04/27/16 0336    Specimen Information:  Plasma Updated:  04/27/16 0505     Thyroid Stimulating Hormone <0.01 (L) uIU/mL     T4, Free [[id number]]  (Abnormal) Collected:  04/27/16 0336    Specimen Information:  Plasma Updated:  04/27/16 0505     T4 Free 3.02 (H) ng/dL     Troponin I [[id number]]  (Abnormal) Collected:  04/27/16 0336    Specimen Information:  Plasma Updated:  04/27/16 0413     Troponin I 0.22 (H) ng/mL     TSH [[id number]]  (Abnormal) Collected:  04/26/16 2241    Specimen Information:  Plasma Updated:  04/27/16 0241     Thyroid Stimulating Hormone <0.01 (L) uIU/mL     Ammonia [[id number]] Collected:  04/27/16 0156    Specimen Information:  Plasma Updated:  04/27/16 0211     Ammonia 40.1 mcg/dL     Urine Drug Screen [[id number]] Collected:  04/27/16 0010    Specimen Information:  Urine, Random Updated:  04/27/16 0047     Creatinine, UR 176.37 mg/dL      pH, Urine 5.0 pH      Specific Gravity, UR 1.029      Amphetamine Negative      Barbiturates Negative      Benzodiazepines Negative      Cannabinoids Negative      Cocaine Negative      Methadone Screen, Urine Negative      Opiates Negative      Urine Oxycodone Negative      Phencyclidine Negative      Buprenorphine, Urine Negative     Urinalysis w Microscopic and Culture if Indicated [[id number]]  (Abnormal) Collected:  04/27/16 0010    Specimen Information:  Urine,  Random Updated:  04/27/16 0026     Color, UA Yellow      Clarity, UA Clear      Specific Gravity, UR 1.021      pH, Urine 5.0 pH      Protein, UR 100 (A) mg/dL      Glucose, UA Negative mg/dL      Ketones UA 20 (A) mg/dL      Bilirubin, UA Negative      Blood, UA Negative      Nitrite, UA Negative      Urobilinogen, UA 2.0 (A) mg/dL      Leukocyte Esterase, UA Negative Leu/uL      UR Micro Performed      WBC, UA 2 /hpf      RBC, UA 5 (H) /hpf      Bacteria, UA Rare (A) /hpf      Squam Epithel, UA <1 /hpf     Troponin I (STAT) [[id number]]   (Abnormal) Collected:  04/26/16 2241    Specimen Information:  Plasma Updated:  04/26/16 2343     Troponin I 0.10 (H) ng/mL     Basic Metabolic Panel [[id number]]  (Abnormal) Collected:  04/26/16 2241    Specimen Information:  Plasma Updated:  04/26/16 2338     Sodium 141 mMol/L      Potassium 4.0 mMol/L      Chloride 108 mMol/L      CO2 22.6 mMol/L      Calcium 9.8 mg/dL      Glucose 629 (H) mg/dL      Creatinine 5.28 (H) mg/dL      BUN 32 (H) mg/dL      Anion Gap 41.3 mMol/L      BUN/Creatinine Ratio 20.5 Ratio      EGFR 48 (L) mL/min/1.89m2      Osmolality Calc 289 mOsm/kg     Alcohol Level [[id number]] Collected:  04/26/16 2241    Specimen Information:  Plasma Updated:  04/26/16 2332     Alcohol <10 mg/dL     CBC and differential [[id number]]  (Abnormal) Collected:  04/26/16 2241    Specimen Information:  Blood from Blood Updated:  04/26/16 2320     WBC 7.3 K/cmm      RBC 4.18 M/cmm      Hemoglobin 11.6 (L) gm/dL      Hematocrit 74.2 (L) %      MCV 81 fL      MCH 28 pg      MCHC 34 gm/dL      RDW 59.5 %      PLT CT 82 (L) K/cmm      MPV 9.4 fL      NEUTROPHIL % 73.1 %      Lymphocytes 14.1 (L) %      Monocytes 10.3 %      Eosinophils % 2.2 %      Basophils % 0.4 %      Neutrophils Absolute 5.4 K/cmm      Lymphocytes Absolute 1.0 K/cmm      Monocytes Absolute 0.8 K/cmm      Eosinophils Absolute 0.2 K/cmm      BASO Absolute 0.0 K/cmm             Rads:   Pertinent imaging studies reviewed.  Ct Abdomen Wo Iv/ Wo Po Cont    04/27/2016  No acute abdominopelvic process. ReadingStation:[email or web address]    Ct Head Wo- Specify Condition/reader    04/27/2016  No acute  intracranial abnormality. ReadingStation:[email or web address]    Ct Cervical Spine Wo Contrast    04/27/2016  No acute cervical injury. ReadingStation:[email or web address]    Mri Brain W Wo Contrast    04/27/2016  Abnormal MRI scan of the head with and without contrast demonstrating a single punctate acute infarction in the right parietal area from the result of emboli. There appears to be  the presence of an extensive right chronic subdural hematoma unchanged from  the CT head study. Slight mass effect without midline shift. Evidence of shear injury as well as a right inferior frontal contusion from trauma. 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 Evidence of mild  diffuse leptomeningeal enhancement that can be the result of trauma, infection, inflammation, or intracranial hypotension. Clinical correlation is recommended. Results discussed with the referring physician shortly after the completion of the study. ReadingStation:[id number]    [place] Head Neck Soft Tissue    04/27/2016  Nonspecific thyroiditis. ReadingStation:[email or web address]    Ct Maxillofacial Bones    04/27/2016  No acute osseous process. ReadingStation:[email or web address]    Xr Chest Ap Portable    04/27/2016  No acute cardiopulmonary process. ReadingStation:[email or web address]        Problem list:     Patient Active Problem List   Diagnosis   . Altered mental status   . Chronic subdural hematoma   . Acute cerebral infarction   . Head trauma, initial encounter         Assessment/Plan:   Chronic SDH- observation- no surgical intervention expected  Periorbital ecchymosis- no basilar skull fracture noted on CT  Continued person, place & situation confusion    I spent approximately 45  minutes on this consultation examining the patient, reviewing pertinent history, labs, radiologic tests and discussing the case with the patient, available staff and family.  Over half of this time was spent counseling.    Thank you for this consultation.    Signed by:  Darreld Mclean Catlett, NP-C                      Neurosurgery        This is a 60 year old right-handed man who comes in with an unclear history but objectively and piecing the story together it seems as though he had a syncopal episode and then a probable fall with significant trauma. He has raccoon eyes indicating possible basilar skull fracture. He has a chronic subdural  fluid collection on both CT and MRI scan that shows no evidence of acute hemorrhage or even subacute hemorrhage. It is on the right side is not causing significant mass effect. The MRI scan is suspicious for multiple embolic episodes with several areas of small FLAIR changes indicating small strokes. He was getting an echocardiogram at the time of my evaluation. Those results are pending.    I spoke to the trauma surgeon on call today, Dr. Reola Calkins, this is a somewhat complicated case and if he has clear indications for the need for anticoagulation such as a clot or some other embolic source for these "strokes" than the benefits of anticoagulation may outweigh the risks. Once all the information is gathered we will try to further delineate the appropriate treatment options. I think if antibiotic irrigation as needed a reversible agent be the best choice such as heparin. If it can be avoided in place at this point I think I would.    Dr. Azzie Almas will be on call this weekend and I will update him  with the information I have thus far but he will be providing further input while I am unavailable.    Barbera Setters. Hamlin Devine, MD  Division of Neurosurgery  [facility]

## 2016-04-27 NOTE — Consults (Signed)
TRAUMA HISTORY AND PHYSICAL - [facility] ACCESS   Name:  Bradley Sims,Bradley JR.                DOB:  1956/07/02   MR#:  [id number]               Date:  04/27/2016       ASSESSMENT:   60 y.o. male with the following active problems:  Active Hospital Problems    Diagnosis   . Altered mental status   . Chronic subdural hematoma   . Acute cerebral infarction   . Head trauma, initial encounter     Patient with B/L orbital ecchymosis and chronic SDH.  Also found to have an embolic CVA.  Patient is amnestic to the event - unclear how and when patient had traumatic event.  Given the ambiguity of his injury did a CT Scan of Abd/Pelvis to rule out occult injury.      He has no evidence of trauma besides the orbital ecchymosis and chronic SDH.       No further traumatic work-up is necessary.  Possible that patient suffered an embolic CVA then had a fall leading to SDH.      [name], MD  ACCESS [phone] or [phone]    Additional Info:  None    Consulting Services:   Neurosurgery - chronic SDH    Alert Level:   NONE    Scene Report   Brought to ED by police after being found driving erratically     CC:    Patient complains of headache    HPI:   60 y.o. male  presents after a traumatic event of unknown mechanism.  Brought to ED after being found driving erratically by police.  No evidence of MVA.    PMH:   He  has a past medical history of Hypertension and Idiopathic thrombocytopenia purpura.     PSH:   He  has no past surgical history on file.     Social History:   He  reports that he has quit smoking. He does not have any smokeless tobacco history on file. He reports that he drinks alcohol.    Family History:   His family history is not on file.       Home Medications:     Current Facility-Administered Medications   Medication Dose Route Frequency Provider Last Rate Last Dose   . acetaminophen (TYLENOL) tablet 650 mg  650 mg Oral Q4H PRN Ilda Mori, MD        Or   . acetaminophen (TYLENOL) 160 MG/5ML oral solution 650 mg  650 mg  per NG tube Q4H PRN Ilda Mori, MD        Or   . acetaminophen (TYLENOL) suppository 650 mg  650 mg Rectal Q4H PRN Cherlynn Perches A, MD       . atorvastatin (LIPITOR) tablet 80 mg  80 mg Oral QHS Reino Bellis, MD       . levETIRAcetam (KEPPRA) tablet 750 mg  750 mg Oral Q12H [facility] Reino Bellis, MD       . sodium chloride 0.9 % 1,000 mL with thiamine 100 mg, folic acid 1 mg infusion   Intravenous Daily Ilda Mori, MD         No current outpatient prescriptions on file.       Allergies:   He has No Known Allergies.     ROS(10):   Tetanus up to date: Unknown  Pertinent details as detailed in HPI.    In addition, patient denies General: no weight loss, fever, night sweats, ENT: negative, Pulmonary: shortness of breath, cough, Cardiac: negative, GI: nausea and vomiting, GU: negative, Muscskel: negative, Psych: negative, Skin: negative and Visual: negative for visual blurring, double vision, eye pain  Patient also admits to Neuro: amnesia     Physical Exam(8):   Blood pressure 169/69, pulse 80, temperature 99 F (37.2 C), temperature source Oral, resp. rate 20, height 1.854 m (6\' 1" ), weight 72.576 kg (160 lb), SpO2 99 %.   General: in no apparent distress  HEENT(2): pupils reactive, bruising at bilateral orbits  Neck(+): No cervical spine bony tenderness, crepitance, or stepoff  Chest: equal breath sounds  Cardiac: regular rate and rhythm without murmurs/rubs/gallops  Abdomen: soft, nontender  Extremities: no deformities   Neuro: alert, no gross focal neurologic deficits, moves all extremities well, no involuntary movements  Psych: cooperative and calm  Back(+): inspection of back is normal    Labs:      Results     Procedure Component Value Units Date/Time    T3, Free [[id number]]  (Abnormal) Collected:  04/27/16 0336    Specimen Information:  Plasma Updated:  04/27/16 0512     T3, Free 9.10 (H) pg/mL     TSH [[id number]]  (Abnormal) Collected:  04/27/16 0336    Specimen Information:  Plasma Updated:   04/27/16 0505     Thyroid Stimulating Hormone <0.01 (L) uIU/mL     T4, Free [[id number]]  (Abnormal) Collected:  04/27/16 0336    Specimen Information:  Plasma Updated:  04/27/16 0505     T4 Free 3.02 (H) ng/dL     Troponin I [[id number]]  (Abnormal) Collected:  04/27/16 0336    Specimen Information:  Plasma Updated:  04/27/16 0413     Troponin I 0.22 (H) ng/mL     TSH [[id number]]  (Abnormal) Collected:  04/26/16 2241    Specimen Information:  Plasma Updated:  04/27/16 0241     Thyroid Stimulating Hormone <0.01 (L) uIU/mL     Ammonia [[id number]] Collected:  04/27/16 0156    Specimen Information:  Plasma Updated:  04/27/16 0211     Ammonia 40.1 mcg/dL     Urine Drug Screen [[id number]] Collected:  04/27/16 0010    Specimen Information:  Urine, Random Updated:  04/27/16 0047     Creatinine, UR 176.37 mg/dL      pH, Urine 5.0 pH      Specific Gravity, UR 1.029      Amphetamine Negative      Barbiturates Negative      Benzodiazepines Negative      Cannabinoids Negative      Cocaine Negative      Methadone Screen, Urine Negative      Opiates Negative      Urine Oxycodone Negative      Phencyclidine Negative      Buprenorphine, Urine Negative     Urinalysis w Microscopic and Culture if Indicated [[id number]]  (Abnormal) Collected:  04/27/16 0010    Specimen Information:  Urine, Random Updated:  04/27/16 0026     Color, UA Yellow      Clarity, UA Clear      Specific Gravity, UR 1.021      pH, Urine 5.0 pH      Protein, UR 100 (A) mg/dL      Glucose, UA Negative mg/dL      Ketones UA 20 (A) mg/dL  Bilirubin, UA Negative      Blood, UA Negative      Nitrite, UA Negative      Urobilinogen, UA 2.0 (A) mg/dL      Leukocyte Esterase, UA Negative Leu/uL      UR Micro Performed      WBC, UA 2 /hpf      RBC, UA 5 (H) /hpf      Bacteria, UA Rare (A) /hpf      Squam Epithel, UA <1 /hpf     Troponin I (STAT) [[id number]]  (Abnormal) Collected:  04/26/16 2241    Specimen Information:  Plasma Updated:  04/26/16 2343     Troponin I 0.10  (H) ng/mL     Basic Metabolic Panel [[id number]]  (Abnormal) Collected:  04/26/16 2241    Specimen Information:  Plasma Updated:  04/26/16 2338     Sodium 141 mMol/L      Potassium 4.0 mMol/L      Chloride 108 mMol/L      CO2 22.6 mMol/L      Calcium 9.8 mg/dL      Glucose 308 (H) mg/dL      Creatinine 6.57 (H) mg/dL      BUN 32 (H) mg/dL      Anion Gap 84.6 mMol/L      BUN/Creatinine Ratio 20.5 Ratio      EGFR 48 (L) mL/min/1.61m2      Osmolality Calc 289 mOsm/kg     Alcohol Level [[id number]] Collected:  04/26/16 2241    Specimen Information:  Plasma Updated:  04/26/16 2332     Alcohol <10 mg/dL     CBC and differential [[id number]]  (Abnormal) Collected:  04/26/16 2241    Specimen Information:  Blood from Blood Updated:  04/26/16 2320     WBC 7.3 K/cmm      RBC 4.18 M/cmm      Hemoglobin 11.6 (L) gm/dL      Hematocrit 25.3 (L) %      MCV 81 fL      MCH 28 pg      MCHC 34 gm/dL      RDW 66.4 %      PLT CT 82 (L) K/cmm      MPV 9.4 fL      NEUTROPHIL % 73.1 %      Lymphocytes 14.1 (L) %      Monocytes 10.3 %      Eosinophils % 2.2 %      Basophils % 0.4 %      Neutrophils Absolute 5.4 K/cmm      Lymphocytes Absolute 1.0 K/cmm      Monocytes Absolute 0.8 K/cmm      Eosinophils Absolute 0.2 K/cmm      BASO Absolute 0.0 K/cmm            Radiology:   Head CT, Cervical Spine CT, Abd CT and CXR  Ct Abdomen Wo Iv/ Wo Po Cont    04/27/2016  No acute abdominopelvic process. ReadingStation:[email or web address]    Ct Head Wo- Specify Condition/reader    04/27/2016  No acute intracranial abnormality. ReadingStation:[email or web address]    Ct Cervical Spine Wo Contrast    04/27/2016  No acute cervical injury. ReadingStation:[email or web address]    Mri Brain W Wo Contrast    04/27/2016  Abnormal MRI scan of the head with and without contrast demonstrating a single punctate acute infarction in the right parietal area from the result of emboli. There appears to be the presence of an  extensive right chronic subdural hematoma unchanged from  the CT head study. Slight  mass effect without midline shift. Evidence of shear injury as well as a right inferior frontal contusion from trauma. 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 Evidence of mild  diffuse leptomeningeal enhancement that can be the result of trauma, infection, inflammation, or intracranial hypotension. Clinical correlation is recommended. Results discussed with the referring physician shortly after the completion of the study. ReadingStation:[id number]    [place] Head Neck Soft Tissue    04/27/2016  Nonspecific thyroiditis. ReadingStation:[email or web address]    Ct Maxillofacial Bones    04/27/2016  No acute osseous process. ReadingStation:[email or web address]    Xr Chest Ap Portable    04/27/2016  No acute cardiopulmonary process. ReadingStation:[email or web address]       The following images were received from an outside facility and reviewed:None     Massive transfusion protocol:  No    Total Critical Care time minus procedures and teaching is No Critical Care Time

## 2016-04-27 NOTE — ED Notes (Addendum)
Sister and father at bedside.   Dr. Lanny Hurst called and put on phone with sister to speak about request to transfer patient.

## 2016-04-27 NOTE — ED Notes (Signed)
Per Dr. Minus Liberty, patient to get MRI prior to going to inpatient room.
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
## 2016-04-27 NOTE — ED Notes (Signed)
Transfer center and Charge RN called and updated at facility that patient has left.

## 2016-04-27 NOTE — ED Notes (Signed)
RN report can be called to ER charge at [phone]

## 2016-04-27 NOTE — ED Notes (Signed)
Patient got up out of bed, started taking all monitors off because he said he needed to void. Assisted patient with a urinal at bedside. Patient states understanding that next time he needs to use call bell. Patient put back in bed, on monitor, bed alarm set.

## 2016-04-27 NOTE — ED Notes (Addendum)
Patient back from CV lab. Patients only complaint is feeling tired. Sister and father at bedside. Sister states Lanny Hurst would consider tx of patient after seeing him and clearing transfer with consulting MD's that patient stable. Sister works in [employer] in [place] . Works in transfer center. Has already spoken with their transfer center RN's and said she would provide any information needed.

## 2016-04-27 NOTE — Procedures (Signed)
PROCEDURE: inpatient EEG  REQUESTING CLINICIAN: Heywood Iles, MD    HISTORY:  The patient is a 60 year old man undergoing evaluation of head trauma of uncertain etiology and lack of memory with disorientation.  This EEG was requested to evaluate for epileptiform abnormalities.    MEDICATIONS:   Discharge Medication List as of 04/27/2016  7:12 PM      CONTINUE these medications which have NOT CHANGED    Details   Cholecalciferol (VITAMIN D-3 PO) Take 1 capsule by mouth daily.    , Until Discontinued, Historical Med      Cyanocobalamin (B-12 PO) Take 1 capsule by mouth 4 (four) times daily.    , Until Discontinued, Historical Med      Multiple Vitamin (ONE-A-DAY MENS PO) Take 1 tablet by mouth daily., Until Discontinued, Historical Med             DESCRIPTION:  The duration of the recording was 21 minutes.  Clinical state: awake    FINDINGS:  The waking background showed some appropriate organization with identifiable anterior-posterior voltage and frequency gradients.  There was a discernible but slow posterior dominant rhythm of 7.5 Hertz, which was symmetrical and showed normal reactivity.  Anteriorly, there was fluctuating and sometimes semirhythmic bifrontal delta range slowing and lower voltage polymorphic theta range slowing within an expected pattern of lower voltage, irregular, mixed faster frequencies.      The EEG response to photic stimulation was unremarkable.    IMPRESSION:  This is an abnormal waking EEG due to a slower than expected posterior dominant rhythm, polymorphic theta range slowing and bifrontal semirhythmic delta range slowing within a background that showed some evidence of appropriate organization. These EEG findings reflect mild to moderate diffuse dysfunction of nonspecific etiology.  There are no focal or epileptiform abnormalities.

## 2016-04-27 NOTE — ED Notes (Signed)
Patient's father and sister state they are going to retrieve his vehicle and get breakfast; they will be back afterwards. Received call stating EEG tech is ready, will be coming to take him for approximately one hour. Will update patient on plan of care

## 2016-04-27 NOTE — ED Notes (Signed)
[name], Dr. Moises Blood PA at bedside. Updated that patient in CV lab.

## 2016-04-27 NOTE — ED Notes (Signed)
EEG called and told RN that patient being taken to CV lab for further testing.

## 2016-04-27 NOTE — ED Notes (Signed)
Per Dr. Minus Liberty verbal order, patient is NPO at this time.

## 2016-04-27 NOTE — ED Notes (Signed)
Dr. Reola Calkins at bedside.   Aundria Rud on plan for transfer.
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
## 2016-05-16 ENCOUNTER — Telehealth: Payer: Self-pay | Admitting: *Deleted

## 2016-05-16 NOTE — Telephone Encounter (Signed)
Sister called, states pt in ICU at [facility] in [place], [place].   He has Subdural hematoma.  She is trying to find history about his ITP and treatment for the Doctors there.  Faxed her Dr. Calton Dach most recent office note which is from 2015.  Informed sister pt was d/c'd from Dr. Alvy Bimler at that time to f/u his PCP.  She verbalized understanding.  Faxed note to [phone].

## 2017-05-22 DEATH — deceased
